# Patient Record
Sex: Male | Born: 1945 | Race: White | Hispanic: No | Marital: Married | Smoking: Former smoker
Health system: Southern US, Community
[De-identification: ages and names within clinical notes are randomized; demographics above are authoritative.]

## PROBLEM LIST (undated history)

## (undated) DIAGNOSIS — L309 Dermatitis, unspecified: Secondary | ICD-10-CM

## (undated) DIAGNOSIS — I2510 Atherosclerotic heart disease of native coronary artery without angina pectoris: Secondary | ICD-10-CM

---

## 2017-05-07 DIAGNOSIS — E784 Other hyperlipidemia: Secondary | ICD-10-CM | POA: Diagnosis not present

## 2017-05-07 DIAGNOSIS — Z23 Encounter for immunization: Secondary | ICD-10-CM | POA: Diagnosis not present

## 2017-05-07 DIAGNOSIS — R69 Illness, unspecified: Secondary | ICD-10-CM | POA: Diagnosis not present

## 2017-05-07 DIAGNOSIS — Z6828 Body mass index (BMI) 28.0-28.9, adult: Secondary | ICD-10-CM | POA: Diagnosis not present

## 2017-05-07 DIAGNOSIS — G4733 Obstructive sleep apnea (adult) (pediatric): Secondary | ICD-10-CM | POA: Diagnosis not present

## 2017-05-07 DIAGNOSIS — N183 Chronic kidney disease, stage 3 (moderate): Secondary | ICD-10-CM | POA: Diagnosis not present

## 2017-05-07 DIAGNOSIS — L409 Psoriasis, unspecified: Secondary | ICD-10-CM | POA: Diagnosis not present

## 2017-05-07 DIAGNOSIS — E038 Other specified hypothyroidism: Secondary | ICD-10-CM | POA: Diagnosis not present

## 2017-05-07 DIAGNOSIS — N401 Enlarged prostate with lower urinary tract symptoms: Secondary | ICD-10-CM | POA: Diagnosis not present

## 2017-05-07 DIAGNOSIS — Z1389 Encounter for screening for other disorder: Secondary | ICD-10-CM | POA: Diagnosis not present

## 2017-09-19 DIAGNOSIS — J40 Bronchitis, not specified as acute or chronic: Secondary | ICD-10-CM | POA: Diagnosis not present

## 2017-09-19 DIAGNOSIS — R05 Cough: Secondary | ICD-10-CM | POA: Diagnosis not present

## 2017-09-19 DIAGNOSIS — Z6827 Body mass index (BMI) 27.0-27.9, adult: Secondary | ICD-10-CM | POA: Diagnosis not present

## 2017-09-19 DIAGNOSIS — E038 Other specified hypothyroidism: Secondary | ICD-10-CM | POA: Diagnosis not present

## 2017-09-19 DIAGNOSIS — R0602 Shortness of breath: Secondary | ICD-10-CM | POA: Diagnosis not present

## 2017-09-19 DIAGNOSIS — E7849 Other hyperlipidemia: Secondary | ICD-10-CM | POA: Diagnosis not present

## 2017-09-19 DIAGNOSIS — R69 Illness, unspecified: Secondary | ICD-10-CM | POA: Diagnosis not present

## 2017-10-30 DIAGNOSIS — R82998 Other abnormal findings in urine: Secondary | ICD-10-CM | POA: Diagnosis not present

## 2017-10-30 DIAGNOSIS — E038 Other specified hypothyroidism: Secondary | ICD-10-CM | POA: Diagnosis not present

## 2017-10-30 DIAGNOSIS — Z0000 Encounter for general adult medical examination without abnormal findings: Secondary | ICD-10-CM | POA: Diagnosis not present

## 2017-10-30 DIAGNOSIS — Z125 Encounter for screening for malignant neoplasm of prostate: Secondary | ICD-10-CM | POA: Diagnosis not present

## 2017-11-11 DIAGNOSIS — J209 Acute bronchitis, unspecified: Secondary | ICD-10-CM | POA: Diagnosis not present

## 2017-11-11 DIAGNOSIS — R0602 Shortness of breath: Secondary | ICD-10-CM | POA: Diagnosis not present

## 2017-11-11 DIAGNOSIS — R69 Illness, unspecified: Secondary | ICD-10-CM | POA: Diagnosis not present

## 2017-11-11 DIAGNOSIS — Z6827 Body mass index (BMI) 27.0-27.9, adult: Secondary | ICD-10-CM | POA: Diagnosis not present

## 2017-11-11 DIAGNOSIS — R05 Cough: Secondary | ICD-10-CM | POA: Diagnosis not present

## 2017-11-14 ENCOUNTER — Other Ambulatory Visit (HOSPITAL_COMMUNITY): Payer: Self-pay | Admitting: Respiratory Therapy

## 2017-11-14 DIAGNOSIS — F17200 Nicotine dependence, unspecified, uncomplicated: Secondary | ICD-10-CM

## 2017-11-14 DIAGNOSIS — R0602 Shortness of breath: Secondary | ICD-10-CM

## 2017-11-19 ENCOUNTER — Ambulatory Visit (HOSPITAL_COMMUNITY)
Admission: RE | Admit: 2017-11-19 | Discharge: 2017-11-19 | Disposition: A | Discharge status: Home / Self Care | Payer: Medicare HMO | Source: Ambulatory Visit | Attending: Endocrinology | Admitting: Endocrinology

## 2017-11-19 DIAGNOSIS — E038 Other specified hypothyroidism: Secondary | ICD-10-CM | POA: Diagnosis not present

## 2017-11-19 DIAGNOSIS — R0602 Shortness of breath: Secondary | ICD-10-CM | POA: Diagnosis not present

## 2017-11-19 DIAGNOSIS — E7849 Other hyperlipidemia: Secondary | ICD-10-CM | POA: Diagnosis not present

## 2017-11-19 DIAGNOSIS — F17200 Nicotine dependence, unspecified, uncomplicated: Secondary | ICD-10-CM | POA: Diagnosis present

## 2017-11-19 DIAGNOSIS — R69 Illness, unspecified: Secondary | ICD-10-CM | POA: Diagnosis not present

## 2017-11-19 DIAGNOSIS — N183 Chronic kidney disease, stage 3 (moderate): Secondary | ICD-10-CM | POA: Diagnosis not present

## 2017-11-19 DIAGNOSIS — I739 Peripheral vascular disease, unspecified: Secondary | ICD-10-CM | POA: Diagnosis not present

## 2017-11-19 DIAGNOSIS — N401 Enlarged prostate with lower urinary tract symptoms: Secondary | ICD-10-CM | POA: Diagnosis not present

## 2017-11-19 DIAGNOSIS — J449 Chronic obstructive pulmonary disease, unspecified: Secondary | ICD-10-CM | POA: Insufficient documentation

## 2017-11-19 DIAGNOSIS — I714 Abdominal aortic aneurysm, without rupture: Secondary | ICD-10-CM | POA: Diagnosis not present

## 2017-11-19 DIAGNOSIS — Z0000 Encounter for general adult medical examination without abnormal findings: Secondary | ICD-10-CM | POA: Diagnosis not present

## 2017-11-19 DIAGNOSIS — G4733 Obstructive sleep apnea (adult) (pediatric): Secondary | ICD-10-CM | POA: Diagnosis not present

## 2017-11-19 LAB — PULMONARY FUNCTION TEST
DL/VA % PRED: 82 %
DL/VA: 3.63 ml/min/mmHg/L
DLCO unc % pred: 53 %
DLCO unc: 15.25 ml/min/mmHg
FEF 25-75 POST: 0.73 L/s
FEF 25-75 Pre: 0.28 L/sec
FEF2575-%Change-Post: 155 %
FEF2575-%PRED-POST: 36 %
FEF2575-%PRED-PRE: 14 %
FEV1-%Change-Post: 32 %
FEV1-%Pred-Post: 45 %
FEV1-%Pred-Pre: 34 %
FEV1-PRE: 0.93 L
FEV1-Post: 1.23 L
FEV1FVC-%Change-Post: -7 %
FEV1FVC-%Pred-Pre: 58 %
FEV6-%Change-Post: 38 %
FEV6-%PRED-PRE: 51 %
FEV6-%Pred-Post: 71 %
FEV6-Post: 2.51 L
FEV6-Pre: 1.82 L
FEV6FVC-%Change-Post: -1 %
FEV6FVC-%Pred-Post: 88 %
FEV6FVC-%Pred-Pre: 90 %
FVC-%CHANGE-POST: 42 %
FVC-%PRED-PRE: 57 %
FVC-%Pred-Post: 81 %
FVC-POST: 3.09 L
FVC-PRE: 2.16 L
PRE FEV1/FVC RATIO: 43 %
Post FEV1/FVC ratio: 40 %
Post FEV6/FVC ratio: 83 %
Pre FEV6/FVC Ratio: 84 %
RV % pred: 221 %
RV: 5.25 L
TLC % pred: 117 %
TLC: 7.55 L

## 2017-11-19 MED ORDER — ALBUTEROL SULFATE (2.5 MG/3ML) 0.083% IN NEBU
2.5000 mg | INHALATION_SOLUTION | Freq: Once | RESPIRATORY_TRACT | Status: AC
  Administered 2017-11-19: 2.5 mg via RESPIRATORY_TRACT

## 2018-04-10 DIAGNOSIS — N183 Chronic kidney disease, stage 3 (moderate): Secondary | ICD-10-CM | POA: Diagnosis not present

## 2018-04-10 DIAGNOSIS — L409 Psoriasis, unspecified: Secondary | ICD-10-CM | POA: Diagnosis not present

## 2018-04-10 DIAGNOSIS — Z6827 Body mass index (BMI) 27.0-27.9, adult: Secondary | ICD-10-CM | POA: Diagnosis not present

## 2018-04-10 DIAGNOSIS — I739 Peripheral vascular disease, unspecified: Secondary | ICD-10-CM | POA: Diagnosis not present

## 2018-04-10 DIAGNOSIS — J449 Chronic obstructive pulmonary disease, unspecified: Secondary | ICD-10-CM | POA: Diagnosis not present

## 2018-04-10 DIAGNOSIS — E7849 Other hyperlipidemia: Secondary | ICD-10-CM | POA: Diagnosis not present

## 2018-04-10 DIAGNOSIS — N401 Enlarged prostate with lower urinary tract symptoms: Secondary | ICD-10-CM | POA: Diagnosis not present

## 2018-04-10 DIAGNOSIS — R69 Illness, unspecified: Secondary | ICD-10-CM | POA: Diagnosis not present

## 2018-04-10 DIAGNOSIS — E038 Other specified hypothyroidism: Secondary | ICD-10-CM | POA: Diagnosis not present

## 2018-06-26 DIAGNOSIS — R69 Illness, unspecified: Secondary | ICD-10-CM | POA: Diagnosis not present

## 2018-07-16 DIAGNOSIS — R69 Illness, unspecified: Secondary | ICD-10-CM | POA: Diagnosis not present

## 2018-07-16 DIAGNOSIS — R0602 Shortness of breath: Secondary | ICD-10-CM | POA: Diagnosis not present

## 2018-07-16 DIAGNOSIS — R05 Cough: Secondary | ICD-10-CM | POA: Diagnosis not present

## 2018-07-16 DIAGNOSIS — Z6826 Body mass index (BMI) 26.0-26.9, adult: Secondary | ICD-10-CM | POA: Diagnosis not present

## 2018-07-16 DIAGNOSIS — J069 Acute upper respiratory infection, unspecified: Secondary | ICD-10-CM | POA: Diagnosis not present

## 2018-07-16 DIAGNOSIS — J019 Acute sinusitis, unspecified: Secondary | ICD-10-CM | POA: Diagnosis not present

## 2018-12-02 DIAGNOSIS — E038 Other specified hypothyroidism: Secondary | ICD-10-CM | POA: Diagnosis not present

## 2018-12-02 DIAGNOSIS — E7849 Other hyperlipidemia: Secondary | ICD-10-CM | POA: Diagnosis not present

## 2018-12-02 DIAGNOSIS — Z125 Encounter for screening for malignant neoplasm of prostate: Secondary | ICD-10-CM | POA: Diagnosis not present

## 2018-12-03 DIAGNOSIS — R69 Illness, unspecified: Secondary | ICD-10-CM | POA: Diagnosis not present

## 2018-12-04 DIAGNOSIS — R82998 Other abnormal findings in urine: Secondary | ICD-10-CM | POA: Diagnosis not present

## 2018-12-11 DIAGNOSIS — N183 Chronic kidney disease, stage 3 (moderate): Secondary | ICD-10-CM | POA: Diagnosis not present

## 2018-12-11 DIAGNOSIS — Z1331 Encounter for screening for depression: Secondary | ICD-10-CM | POA: Diagnosis not present

## 2018-12-11 DIAGNOSIS — L409 Psoriasis, unspecified: Secondary | ICD-10-CM | POA: Diagnosis not present

## 2018-12-11 DIAGNOSIS — N401 Enlarged prostate with lower urinary tract symptoms: Secondary | ICD-10-CM | POA: Diagnosis not present

## 2018-12-11 DIAGNOSIS — J449 Chronic obstructive pulmonary disease, unspecified: Secondary | ICD-10-CM | POA: Diagnosis not present

## 2018-12-11 DIAGNOSIS — R69 Illness, unspecified: Secondary | ICD-10-CM | POA: Diagnosis not present

## 2018-12-11 DIAGNOSIS — Z0000 Encounter for general adult medical examination without abnormal findings: Secondary | ICD-10-CM | POA: Diagnosis not present

## 2018-12-11 DIAGNOSIS — I739 Peripheral vascular disease, unspecified: Secondary | ICD-10-CM | POA: Diagnosis not present

## 2018-12-11 DIAGNOSIS — Z1339 Encounter for screening examination for other mental health and behavioral disorders: Secondary | ICD-10-CM | POA: Diagnosis not present

## 2018-12-11 DIAGNOSIS — E785 Hyperlipidemia, unspecified: Secondary | ICD-10-CM | POA: Diagnosis not present

## 2018-12-11 DIAGNOSIS — I714 Abdominal aortic aneurysm, without rupture: Secondary | ICD-10-CM | POA: Diagnosis not present

## 2018-12-11 DIAGNOSIS — E039 Hypothyroidism, unspecified: Secondary | ICD-10-CM | POA: Diagnosis not present

## 2019-05-19 DIAGNOSIS — N401 Enlarged prostate with lower urinary tract symptoms: Secondary | ICD-10-CM | POA: Diagnosis not present

## 2019-05-19 DIAGNOSIS — J449 Chronic obstructive pulmonary disease, unspecified: Secondary | ICD-10-CM | POA: Diagnosis not present

## 2019-05-19 DIAGNOSIS — N1831 Chronic kidney disease, stage 3a: Secondary | ICD-10-CM | POA: Diagnosis not present

## 2019-05-19 DIAGNOSIS — E039 Hypothyroidism, unspecified: Secondary | ICD-10-CM | POA: Diagnosis not present

## 2019-05-19 DIAGNOSIS — G4733 Obstructive sleep apnea (adult) (pediatric): Secondary | ICD-10-CM | POA: Diagnosis not present

## 2019-05-19 DIAGNOSIS — R69 Illness, unspecified: Secondary | ICD-10-CM | POA: Diagnosis not present

## 2019-05-19 DIAGNOSIS — E785 Hyperlipidemia, unspecified: Secondary | ICD-10-CM | POA: Diagnosis not present

## 2019-05-19 DIAGNOSIS — I739 Peripheral vascular disease, unspecified: Secondary | ICD-10-CM | POA: Diagnosis not present

## 2019-05-19 DIAGNOSIS — L409 Psoriasis, unspecified: Secondary | ICD-10-CM | POA: Diagnosis not present

## 2019-05-21 DIAGNOSIS — Z23 Encounter for immunization: Secondary | ICD-10-CM | POA: Diagnosis not present

## 2019-08-12 DIAGNOSIS — R634 Abnormal weight loss: Secondary | ICD-10-CM | POA: Diagnosis not present

## 2019-08-12 DIAGNOSIS — R69 Illness, unspecified: Secondary | ICD-10-CM | POA: Diagnosis not present

## 2019-08-12 DIAGNOSIS — J449 Chronic obstructive pulmonary disease, unspecified: Secondary | ICD-10-CM | POA: Diagnosis not present

## 2019-08-12 DIAGNOSIS — Z800 Family history of malignant neoplasm of digestive organs: Secondary | ICD-10-CM | POA: Diagnosis not present

## 2019-08-12 DIAGNOSIS — R5383 Other fatigue: Secondary | ICD-10-CM | POA: Diagnosis not present

## 2019-08-12 DIAGNOSIS — Z801 Family history of malignant neoplasm of trachea, bronchus and lung: Secondary | ICD-10-CM | POA: Diagnosis not present

## 2019-08-12 DIAGNOSIS — E039 Hypothyroidism, unspecified: Secondary | ICD-10-CM | POA: Diagnosis not present

## 2019-08-16 ENCOUNTER — Other Ambulatory Visit: Payer: Self-pay | Admitting: Endocrinology

## 2019-08-16 DIAGNOSIS — J410 Simple chronic bronchitis: Secondary | ICD-10-CM

## 2019-08-16 DIAGNOSIS — F17210 Nicotine dependence, cigarettes, uncomplicated: Secondary | ICD-10-CM

## 2019-08-24 ENCOUNTER — Ambulatory Visit
Admission: RE | Admit: 2019-08-24 | Discharge: 2019-08-24 | Disposition: A | Discharge status: Home / Self Care | Payer: Medicare HMO | Source: Ambulatory Visit | Attending: Endocrinology | Admitting: Endocrinology

## 2019-08-24 DIAGNOSIS — R69 Illness, unspecified: Secondary | ICD-10-CM | POA: Diagnosis not present

## 2019-08-24 DIAGNOSIS — F17210 Nicotine dependence, cigarettes, uncomplicated: Secondary | ICD-10-CM

## 2019-08-24 DIAGNOSIS — J410 Simple chronic bronchitis: Secondary | ICD-10-CM

## 2019-09-09 DIAGNOSIS — J449 Chronic obstructive pulmonary disease, unspecified: Secondary | ICD-10-CM | POA: Diagnosis not present

## 2019-09-09 DIAGNOSIS — Z800 Family history of malignant neoplasm of digestive organs: Secondary | ICD-10-CM | POA: Diagnosis not present

## 2019-09-09 DIAGNOSIS — R634 Abnormal weight loss: Secondary | ICD-10-CM | POA: Diagnosis not present

## 2019-09-09 DIAGNOSIS — R69 Illness, unspecified: Secondary | ICD-10-CM | POA: Diagnosis not present

## 2019-09-09 DIAGNOSIS — E875 Hyperkalemia: Secondary | ICD-10-CM | POA: Diagnosis not present

## 2019-09-09 DIAGNOSIS — E039 Hypothyroidism, unspecified: Secondary | ICD-10-CM | POA: Diagnosis not present

## 2019-10-08 ENCOUNTER — Ambulatory Visit: Payer: Medicare HMO | Attending: Internal Medicine

## 2019-10-08 DIAGNOSIS — Z23 Encounter for immunization: Secondary | ICD-10-CM | POA: Insufficient documentation

## 2019-10-08 NOTE — Progress Notes (Signed)
   Covid-19 Vaccination Clinic  Name:  Harry Lucas    MRN: [id number] DOB: 10-22-1945  10/08/2019  [name] was observed post Covid-19 immunization for 15 minutes without incidence. He was provided with Vaccine Information Sheet and instruction to access the V-Safe system.   [name] was instructed to call 911 with any severe reactions post vaccine: Marland Kitchen Difficulty breathing  . Swelling of your face and throat  . A fast heartbeat  . A bad rash all over your body  . Dizziness and weakness    Immunizations Administered    Name Date Dose VIS Date Route   Pfizer COVID-19 Vaccine 10/08/2019 11:11 AM 0.3 mL 07/23/2019 Intramuscular   Manufacturer: [employer], [employer]   Lot: [id number]   NDC: [address]-4

## 2019-11-02 ENCOUNTER — Ambulatory Visit: Payer: Medicare HMO | Attending: Internal Medicine

## 2019-11-02 DIAGNOSIS — Z23 Encounter for immunization: Secondary | ICD-10-CM

## 2019-11-02 NOTE — Progress Notes (Signed)
   Covid-19 Vaccination Clinic  Name:  Suresh Audi    MRN: [id number] DOB: [birth year]  11/02/2019  Mr. Dente was observed post Covid-19 immunization for 15 minutes without incident. He was provided with Vaccine Information Sheet and instruction to access the V-Safe system.   Mr. Baylock was instructed to call 911 with any severe reactions post vaccine: Marland Kitchen Difficulty breathing  . Swelling of face and throat  . A fast heartbeat  . A bad rash all over body  . Dizziness and weakness   Immunizations Administered    Name Date Dose VIS Date Route   Pfizer COVID-19 Vaccine 11/02/2019  1:27 PM 0.3 mL 07/23/2019 Intramuscular   Manufacturer: [employer], [employer]   Lot: [id number]   NDC: [address]-7

## 2019-12-07 DIAGNOSIS — E7849 Other hyperlipidemia: Secondary | ICD-10-CM | POA: Diagnosis not present

## 2019-12-07 DIAGNOSIS — Z0000 Encounter for general adult medical examination without abnormal findings: Secondary | ICD-10-CM | POA: Diagnosis not present

## 2019-12-07 DIAGNOSIS — Z125 Encounter for screening for malignant neoplasm of prostate: Secondary | ICD-10-CM | POA: Diagnosis not present

## 2019-12-07 DIAGNOSIS — E038 Other specified hypothyroidism: Secondary | ICD-10-CM | POA: Diagnosis not present

## 2019-12-13 DIAGNOSIS — I700 Atherosclerosis of aorta: Secondary | ICD-10-CM | POA: Diagnosis not present

## 2019-12-13 DIAGNOSIS — R82998 Other abnormal findings in urine: Secondary | ICD-10-CM | POA: Diagnosis not present

## 2019-12-13 DIAGNOSIS — D7589 Other specified diseases of blood and blood-forming organs: Secondary | ICD-10-CM | POA: Diagnosis not present

## 2019-12-13 DIAGNOSIS — J449 Chronic obstructive pulmonary disease, unspecified: Secondary | ICD-10-CM | POA: Diagnosis not present

## 2019-12-13 DIAGNOSIS — I2510 Atherosclerotic heart disease of native coronary artery without angina pectoris: Secondary | ICD-10-CM | POA: Diagnosis not present

## 2019-12-13 DIAGNOSIS — R634 Abnormal weight loss: Secondary | ICD-10-CM | POA: Diagnosis not present

## 2019-12-13 DIAGNOSIS — R69 Illness, unspecified: Secondary | ICD-10-CM | POA: Diagnosis not present

## 2019-12-13 DIAGNOSIS — L409 Psoriasis, unspecified: Secondary | ICD-10-CM | POA: Diagnosis not present

## 2019-12-13 DIAGNOSIS — Z0000 Encounter for general adult medical examination without abnormal findings: Secondary | ICD-10-CM | POA: Diagnosis not present

## 2019-12-13 DIAGNOSIS — N281 Cyst of kidney, acquired: Secondary | ICD-10-CM | POA: Diagnosis not present

## 2019-12-13 DIAGNOSIS — I739 Peripheral vascular disease, unspecified: Secondary | ICD-10-CM | POA: Diagnosis not present

## 2020-01-07 DIAGNOSIS — L821 Other seborrheic keratosis: Secondary | ICD-10-CM | POA: Diagnosis not present

## 2020-01-07 DIAGNOSIS — Z79899 Other long term (current) drug therapy: Secondary | ICD-10-CM | POA: Diagnosis not present

## 2020-01-07 DIAGNOSIS — L400 Psoriasis vulgaris: Secondary | ICD-10-CM | POA: Diagnosis not present

## 2020-01-17 DIAGNOSIS — I2510 Atherosclerotic heart disease of native coronary artery without angina pectoris: Secondary | ICD-10-CM | POA: Diagnosis present

## 2020-01-17 DIAGNOSIS — I700 Atherosclerosis of aorta: Secondary | ICD-10-CM | POA: Insufficient documentation

## 2020-01-18 DIAGNOSIS — I2510 Atherosclerotic heart disease of native coronary artery without angina pectoris: Secondary | ICD-10-CM | POA: Diagnosis not present

## 2020-01-18 DIAGNOSIS — E7800 Pure hypercholesterolemia, unspecified: Secondary | ICD-10-CM | POA: Diagnosis not present

## 2020-01-18 DIAGNOSIS — I700 Atherosclerosis of aorta: Secondary | ICD-10-CM | POA: Diagnosis not present

## 2020-01-18 DIAGNOSIS — R0600 Dyspnea, unspecified: Secondary | ICD-10-CM | POA: Diagnosis not present

## 2020-01-19 DIAGNOSIS — I498 Other specified cardiac arrhythmias: Secondary | ICD-10-CM | POA: Diagnosis not present

## 2020-02-22 DIAGNOSIS — H2512 Age-related nuclear cataract, left eye: Secondary | ICD-10-CM | POA: Diagnosis not present

## 2020-02-22 DIAGNOSIS — H04123 Dry eye syndrome of bilateral lacrimal glands: Secondary | ICD-10-CM | POA: Diagnosis not present

## 2020-02-22 DIAGNOSIS — H524 Presbyopia: Secondary | ICD-10-CM | POA: Diagnosis not present

## 2020-03-09 DIAGNOSIS — R0600 Dyspnea, unspecified: Secondary | ICD-10-CM | POA: Diagnosis not present

## 2020-03-09 DIAGNOSIS — I2510 Atherosclerotic heart disease of native coronary artery without angina pectoris: Secondary | ICD-10-CM | POA: Diagnosis not present

## 2020-06-13 DIAGNOSIS — I700 Atherosclerosis of aorta: Secondary | ICD-10-CM | POA: Diagnosis not present

## 2020-06-13 DIAGNOSIS — E785 Hyperlipidemia, unspecified: Secondary | ICD-10-CM | POA: Diagnosis not present

## 2020-06-13 DIAGNOSIS — R69 Illness, unspecified: Secondary | ICD-10-CM | POA: Diagnosis not present

## 2020-06-13 DIAGNOSIS — I2510 Atherosclerotic heart disease of native coronary artery without angina pectoris: Secondary | ICD-10-CM | POA: Diagnosis not present

## 2020-06-13 DIAGNOSIS — N401 Enlarged prostate with lower urinary tract symptoms: Secondary | ICD-10-CM | POA: Diagnosis not present

## 2020-06-13 DIAGNOSIS — I739 Peripheral vascular disease, unspecified: Secondary | ICD-10-CM | POA: Diagnosis not present

## 2020-06-13 DIAGNOSIS — J449 Chronic obstructive pulmonary disease, unspecified: Secondary | ICD-10-CM | POA: Diagnosis not present

## 2020-06-13 DIAGNOSIS — E039 Hypothyroidism, unspecified: Secondary | ICD-10-CM | POA: Diagnosis not present

## 2020-06-13 DIAGNOSIS — I714 Abdominal aortic aneurysm, without rupture: Secondary | ICD-10-CM | POA: Diagnosis not present

## 2021-01-25 DIAGNOSIS — G4733 Obstructive sleep apnea (adult) (pediatric): Secondary | ICD-10-CM | POA: Insufficient documentation

## 2021-01-25 DIAGNOSIS — L2084 Intrinsic (allergic) eczema: Secondary | ICD-10-CM

## 2021-05-22 IMAGING — CT CT CHEST LUNG CANCER SCREENING LOW DOSE W/O CM
2 of 5 series · 15 of 40 positions shown, 18 images · non-contrast
Comparison: None.

CLINICAL DATA: 73-year-old male current smoker, with 60 pack-year
history of smoking, for initial lung cancer screening

EXAM:
CT CHEST WITHOUT CONTRAST LOW-DOSE FOR LUNG CANCER SCREENING
TECHNIQUE: Multidetector CT imaging of the chest was performed following the
standard protocol without IV contrast.

[Series 4: lung 1.00 br44 cor · coronal · 0.68mm/px · 3 of 297 slices shown]
[im 60/297  lung]
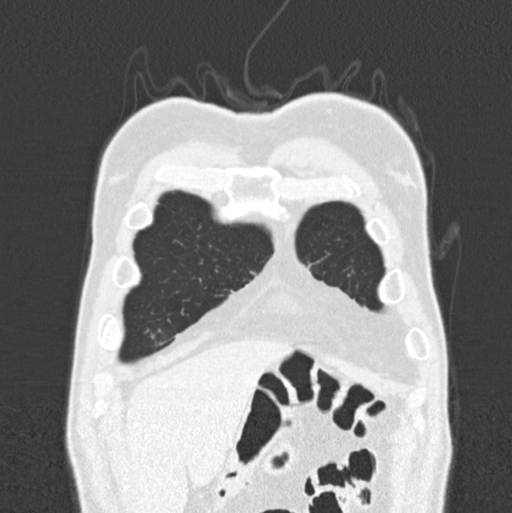
[im 119/297  lung]
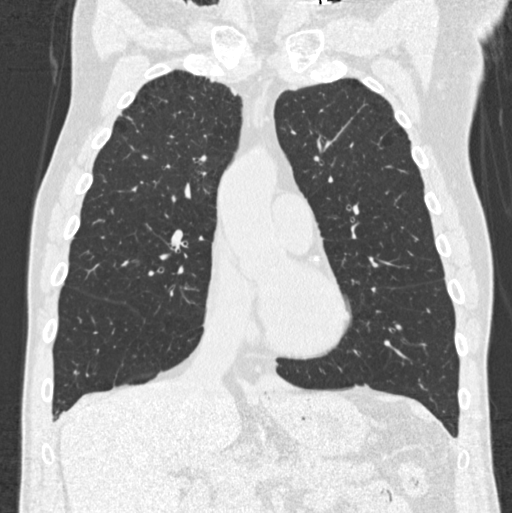
[im 178/297  lung]
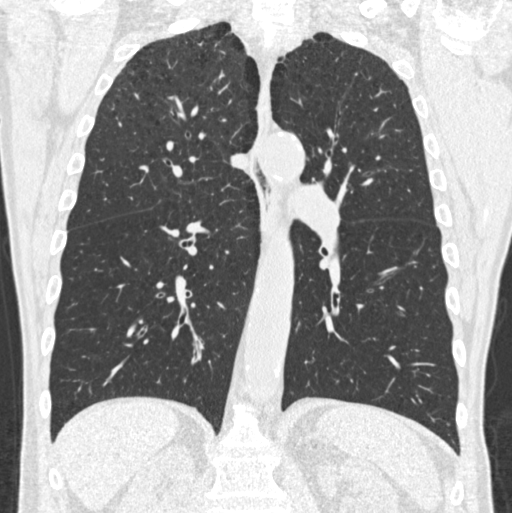

[Series 9: lung 1.00 br60 axial · axial · 0.68mm/px · z∈[-1210,-892]mm · 12 of 350 slices shown, 15 images]
[im 16/350  mediastinal]
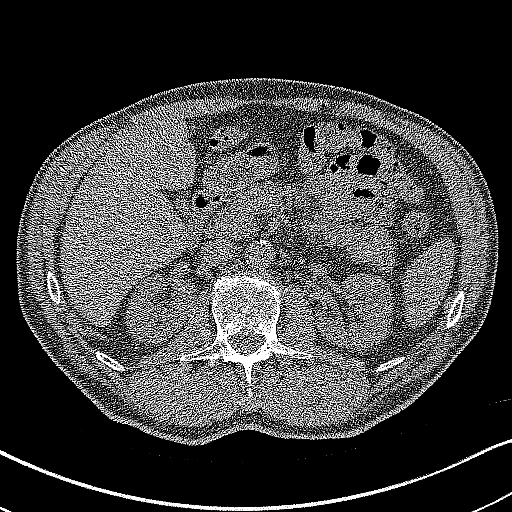
[im 16/350  lung]
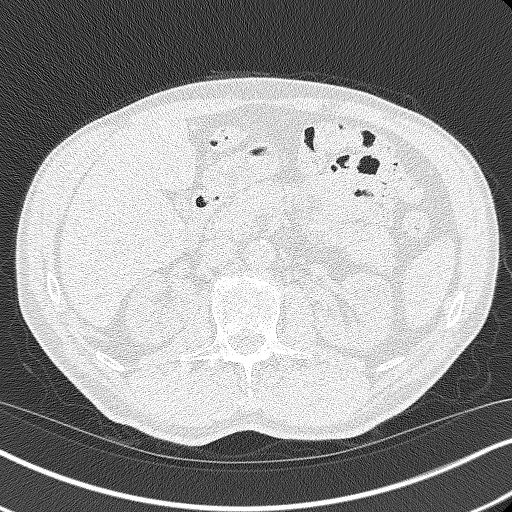
[im 48/350  lung]
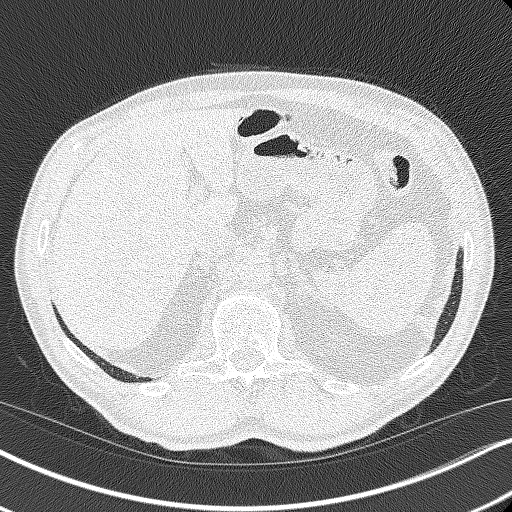
[im 80/350  lung]
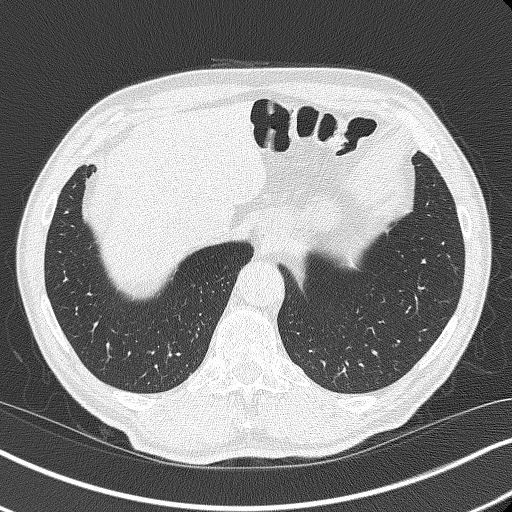
[im 112/350  lung]
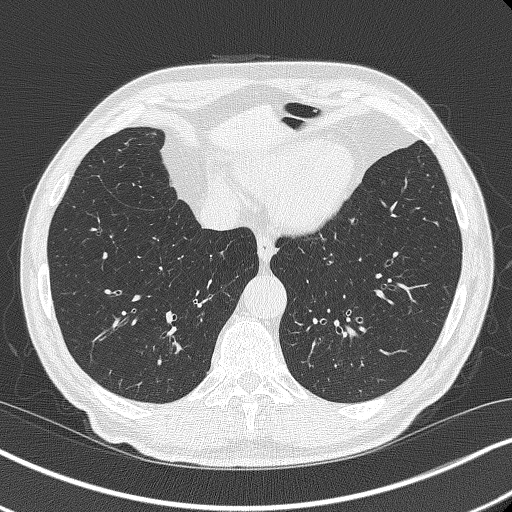
[im 127/350  mediastinal]
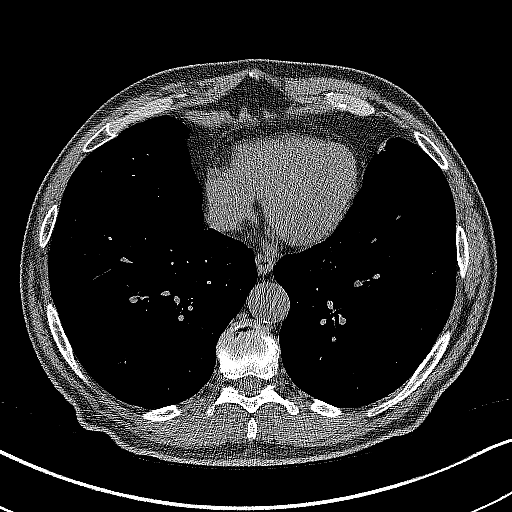
[im 127/350  lung]
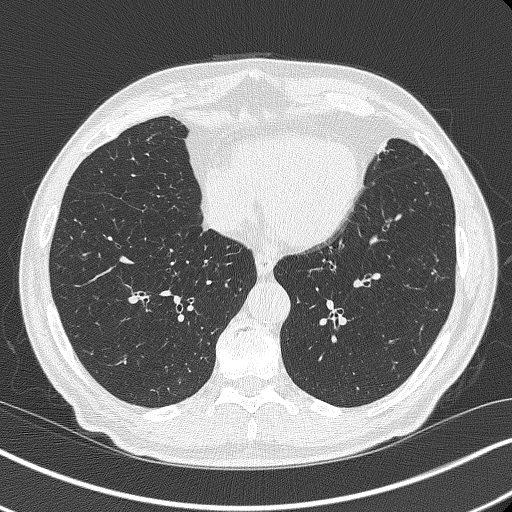
[im 159/350  lung]
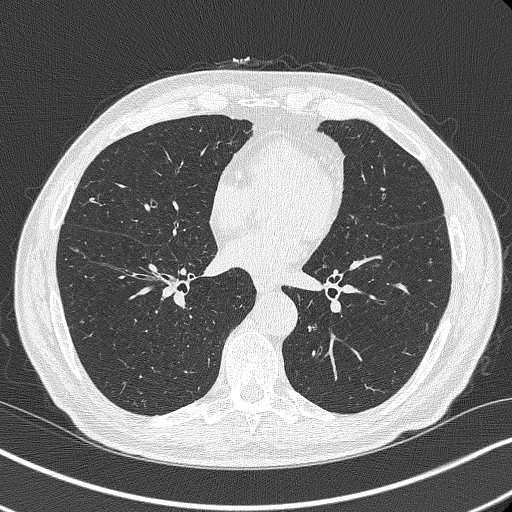
[im 191/350  lung]
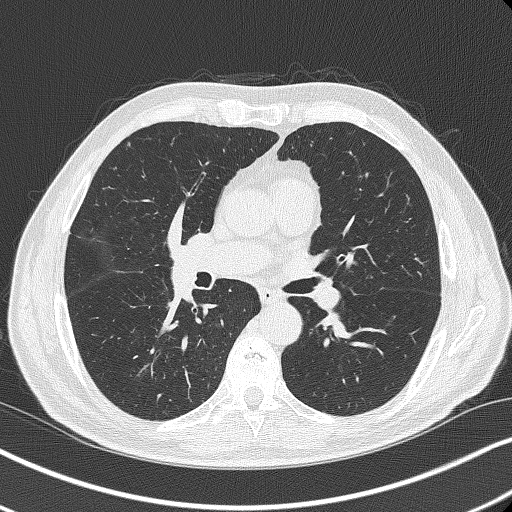
[im 223/350  lung]
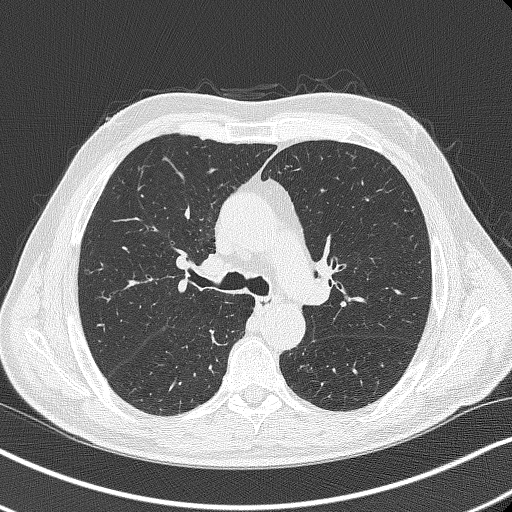
[im 238/350  mediastinal]
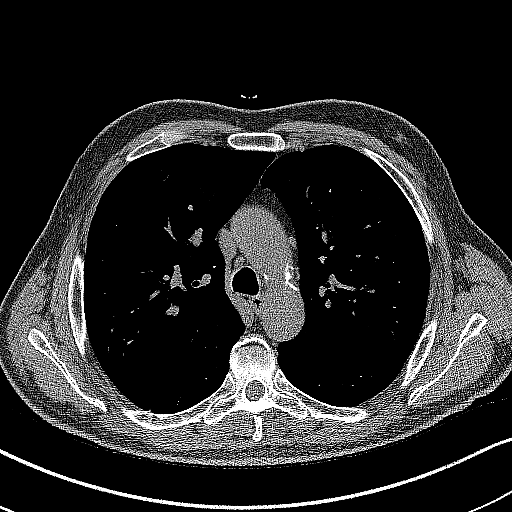
[im 238/350  lung]
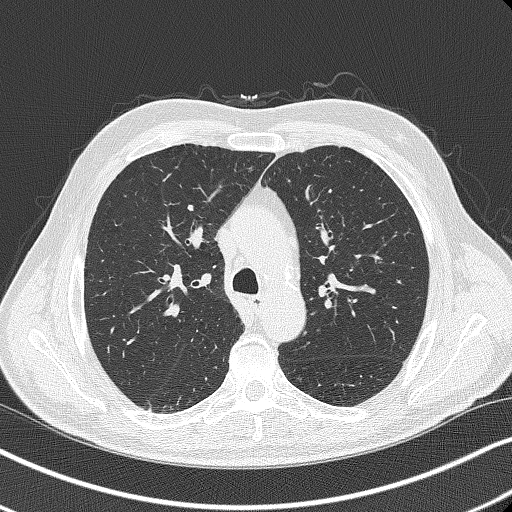
[im 270/350  lung]
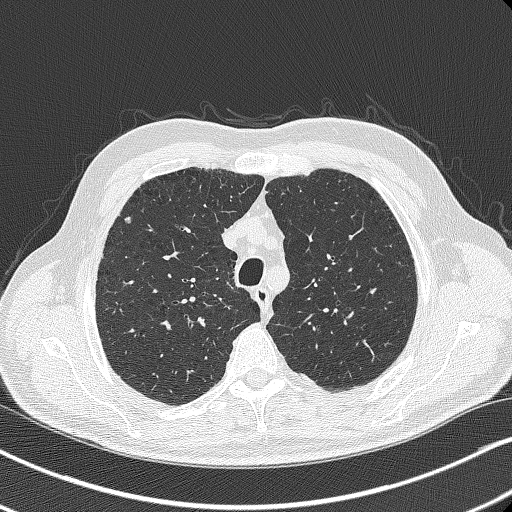
[im 302/350  lung]
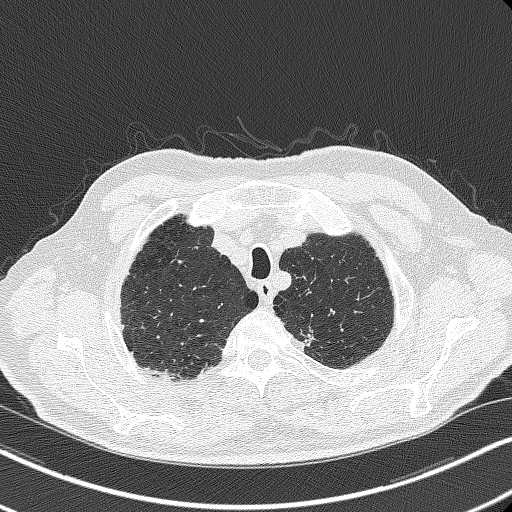
[im 334/350  lung]
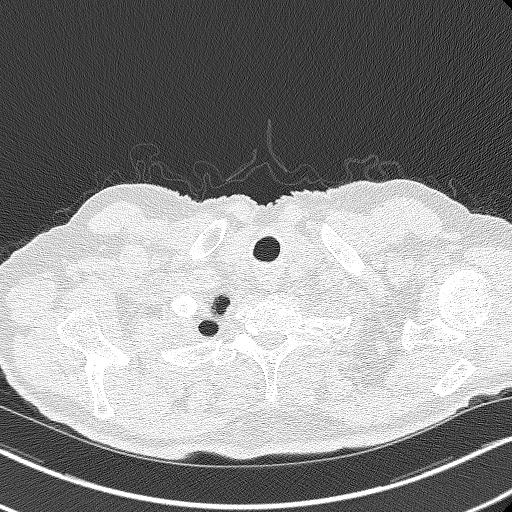

[15 of 40 positions shown; findings below may reference images not displayed]

FINDINGS: Cardiovascular: Heart is normal in size. No pericardial effusion.

No evidence of thoracic aortic aneurysm. Atherosclerotic
calcifications of the aortic arch.

Mild three-vessel coronary atherosclerosis.

Mediastinum/Nodes: No suspicious mediastinal lymphadenopathy.

Visualized thyroid is unremarkable.

Lungs/Pleura: Biapical pleural-parenchymal scarring.

Moderate centrilobular and paraseptal emphysematous changes, upper
lung predominant.

No focal consolidation.

Scattered small bilateral pulmonary nodules, including a 5.7 mm
subpleural nodule in the medial right upper lobe and a 5.7 mm
subpleural nodule in the posterior left upper lobe.

No pleural effusion or pneumothorax.

Upper Abdomen: Visualized upper abdomen is grossly unremarkable,
noting bilateral renal cysts measuring up to 2.5 cm on the left and
vascular calcifications.

Musculoskeletal: Visualized osseous structures are within normal
limits.
IMPRESSION: Lung-RADS 2, benign appearance or behavior. Continue annual
screening with low-dose chest CT without contrast in 12 months.

Aortic Atherosclerosis (IJNN4-2D5.5) and Emphysema (IJNN4-VPJ.W).

## 2021-08-11 ENCOUNTER — Emergency Department (HOSPITAL_COMMUNITY): Payer: Medicare Other

## 2021-08-11 ENCOUNTER — Encounter (HOSPITAL_COMMUNITY): Payer: Self-pay | Admitting: Emergency Medicine

## 2021-08-11 ENCOUNTER — Inpatient Hospital Stay (HOSPITAL_COMMUNITY): Payer: Medicare Other

## 2021-08-11 ENCOUNTER — Observation Stay (HOSPITAL_COMMUNITY)
Admission: EM | Admit: 2021-08-11 | Discharge: 2021-08-12 | Disposition: A | Discharge status: Home / Self Care | Payer: Medicare Other | Attending: Student | Admitting: Student

## 2021-08-11 DIAGNOSIS — N179 Acute kidney failure, unspecified: Secondary | ICD-10-CM | POA: Diagnosis not present

## 2021-08-11 DIAGNOSIS — I10 Essential (primary) hypertension: Secondary | ICD-10-CM | POA: Diagnosis not present

## 2021-08-11 DIAGNOSIS — E860 Dehydration: Secondary | ICD-10-CM

## 2021-08-11 DIAGNOSIS — J440 Chronic obstructive pulmonary disease with acute lower respiratory infection: Secondary | ICD-10-CM

## 2021-08-11 DIAGNOSIS — J189 Pneumonia, unspecified organism: Secondary | ICD-10-CM | POA: Insufficient documentation

## 2021-08-11 DIAGNOSIS — Z20822 Contact with and (suspected) exposure to covid-19: Secondary | ICD-10-CM | POA: Diagnosis not present

## 2021-08-11 DIAGNOSIS — A419 Sepsis, unspecified organism: Principal | ICD-10-CM | POA: Insufficient documentation

## 2021-08-11 DIAGNOSIS — J9601 Acute respiratory failure with hypoxia: Secondary | ICD-10-CM | POA: Diagnosis not present

## 2021-08-11 DIAGNOSIS — R6520 Severe sepsis without septic shock: Secondary | ICD-10-CM

## 2021-08-11 DIAGNOSIS — J111 Influenza due to unidentified influenza virus with other respiratory manifestations: Secondary | ICD-10-CM

## 2021-08-11 DIAGNOSIS — D72820 Lymphocytosis (symptomatic): Secondary | ICD-10-CM | POA: Diagnosis not present

## 2021-08-11 DIAGNOSIS — Z87891 Personal history of nicotine dependence: Secondary | ICD-10-CM | POA: Diagnosis not present

## 2021-08-11 DIAGNOSIS — Z79899 Other long term (current) drug therapy: Secondary | ICD-10-CM | POA: Insufficient documentation

## 2021-08-11 DIAGNOSIS — J09X1 Influenza due to identified novel influenza A virus with pneumonia: Secondary | ICD-10-CM | POA: Diagnosis not present

## 2021-08-11 DIAGNOSIS — R140 Abdominal distension (gaseous): Secondary | ICD-10-CM

## 2021-08-11 DIAGNOSIS — J441 Chronic obstructive pulmonary disease with (acute) exacerbation: Secondary | ICD-10-CM | POA: Diagnosis not present

## 2021-08-11 DIAGNOSIS — R6521 Severe sepsis with septic shock: Secondary | ICD-10-CM | POA: Insufficient documentation

## 2021-08-11 DIAGNOSIS — I2510 Atherosclerotic heart disease of native coronary artery without angina pectoris: Secondary | ICD-10-CM | POA: Diagnosis not present

## 2021-08-11 DIAGNOSIS — L309 Dermatitis, unspecified: Secondary | ICD-10-CM

## 2021-08-11 DIAGNOSIS — E039 Hypothyroidism, unspecified: Secondary | ICD-10-CM | POA: Diagnosis not present

## 2021-08-11 DIAGNOSIS — R0602 Shortness of breath: Secondary | ICD-10-CM | POA: Diagnosis present

## 2021-08-11 DIAGNOSIS — E871 Hypo-osmolality and hyponatremia: Secondary | ICD-10-CM

## 2021-08-11 DIAGNOSIS — L2084 Intrinsic (allergic) eczema: Secondary | ICD-10-CM

## 2021-08-11 DIAGNOSIS — J209 Acute bronchitis, unspecified: Secondary | ICD-10-CM

## 2021-08-11 DIAGNOSIS — J101 Influenza due to other identified influenza virus with other respiratory manifestations: Secondary | ICD-10-CM | POA: Diagnosis present

## 2021-08-11 DIAGNOSIS — R197 Diarrhea, unspecified: Secondary | ICD-10-CM

## 2021-08-11 HISTORY — DX: Dermatitis, unspecified: L30.9

## 2021-08-11 HISTORY — DX: Atherosclerotic heart disease of native coronary artery without angina pectoris: I25.10

## 2021-08-11 LAB — SODIUM, URINE, RANDOM: Sodium, Ur: 23 mmol/L

## 2021-08-11 LAB — CBC WITH DIFFERENTIAL/PLATELET
Abs Immature Granulocytes: 0.08 10*3/uL — ABNORMAL HIGH (ref 0.00–0.07)
Basophils Absolute: 0 10*3/uL (ref 0.0–0.1)
Basophils Relative: 0 %
Eosinophils Absolute: 0.1 10*3/uL (ref 0.0–0.5)
Eosinophils Relative: 0 %
HCT: 38.7 % — ABNORMAL LOW (ref 39.0–52.0)
Hemoglobin: 12.8 g/dL — ABNORMAL LOW (ref 13.0–17.0)
Immature Granulocytes: 1 %
Lymphocytes Relative: 5 %
Lymphs Abs: 0.8 10*3/uL (ref 0.7–4.0)
MCH: 33.8 pg (ref 26.0–34.0)
MCHC: 33.1 g/dL (ref 30.0–36.0)
MCV: 102.1 fL — ABNORMAL HIGH (ref 80.0–100.0)
Monocytes Absolute: 1.4 10*3/uL — ABNORMAL HIGH (ref 0.1–1.0)
Monocytes Relative: 9 %
Neutro Abs: 13.6 10*3/uL — ABNORMAL HIGH (ref 1.7–7.7)
Neutrophils Relative %: 85 %
Platelets: 275 10*3/uL (ref 150–400)
RBC: 3.79 MIL/uL — ABNORMAL LOW (ref 4.22–5.81)
RDW: 13.2 % (ref 11.5–15.5)
WBC: 15.9 10*3/uL — ABNORMAL HIGH (ref 4.0–10.5)
nRBC: 0 % (ref 0.0–0.2)

## 2021-08-11 LAB — URINALYSIS, COMPLETE (UACMP) WITH MICROSCOPIC
Bacteria, UA: NONE SEEN
Bilirubin Urine: NEGATIVE
Glucose, UA: NEGATIVE mg/dL
Ketones, ur: 5 mg/dL — AB
Leukocytes,Ua: NEGATIVE
Nitrite: NEGATIVE
Protein, ur: 30 mg/dL — AB
Specific Gravity, Urine: 1.014 (ref 1.005–1.030)
pH: 5 (ref 5.0–8.0)

## 2021-08-11 LAB — BASIC METABOLIC PANEL
Anion gap: 8 (ref 5–15)
BUN: 22 mg/dL (ref 8–23)
CO2: 24 mmol/L (ref 22–32)
Calcium: 8.8 mg/dL — ABNORMAL LOW (ref 8.9–10.3)
Chloride: 100 mmol/L (ref 98–111)
Creatinine, Ser: 1.83 mg/dL — ABNORMAL HIGH (ref 0.61–1.24)
GFR, Estimated: 38 mL/min — ABNORMAL LOW (ref >60)
Glucose, Bld: 113 mg/dL — ABNORMAL HIGH (ref 70–99)
Potassium: 3.9 mmol/L (ref 3.5–5.1)
Sodium: 132 mmol/L — ABNORMAL LOW (ref 135–145)

## 2021-08-11 LAB — BLOOD GAS, VENOUS
Acid-Base Excess: 0 mmol/L (ref 0.0–2.0)
Acid-base deficit: 1 mmol/L (ref 0.0–2.0)
Bicarbonate: 24.7 mmol/L (ref 20.0–28.0)
Bicarbonate: 25.2 mmol/L (ref 20.0–28.0)
O2 Saturation: 84.2 %
O2 Saturation: 24.8 %
Patient temperature: 98.6
Patient temperature: 98.6
pCO2, Ven: 42.9 mmHg — ABNORMAL LOW (ref 44.0–60.0)
pCO2, Ven: 51.1 mmHg (ref 44.0–60.0)
pH, Ven: 7.379 (ref 7.250–7.430)
pH, Ven: 7.313 (ref 7.250–7.430)
pO2, Ven: 52.4 mmHg — ABNORMAL HIGH (ref 32.0–45.0)
pO2, Ven: 20.9 mmHg — CL (ref 32.0–45.0)

## 2021-08-11 LAB — HEPATIC FUNCTION PANEL
ALT: 29 U/L (ref 0–44)
AST: 20 U/L (ref 15–41)
Albumin: 3.2 g/dL — ABNORMAL LOW (ref 3.5–5.0)
Alkaline Phosphatase: 51 U/L (ref 38–126)
Bilirubin, Direct: 0.2 mg/dL (ref 0.0–0.2)
Indirect Bilirubin: 0.5 mg/dL (ref 0.3–0.9)
Total Bilirubin: 0.7 mg/dL (ref 0.3–1.2)
Total Protein: 7.2 g/dL (ref 6.5–8.1)

## 2021-08-11 LAB — TSH: TSH: 0.987 u[IU]/mL (ref 0.350–4.500)

## 2021-08-11 LAB — RESP PANEL BY RT-PCR (FLU A&B, COVID) ARPGX2
Influenza A by PCR: NEGATIVE
Influenza B by PCR: NEGATIVE
SARS Coronavirus 2 by RT PCR: NEGATIVE

## 2021-08-11 LAB — CREATININE, URINE, RANDOM: Creatinine, Urine: 154.13 mg/dL

## 2021-08-11 LAB — D-DIMER, QUANTITATIVE: D-Dimer, Quant: 1.01 ug/mL-FEU — ABNORMAL HIGH (ref 0.00–0.50)

## 2021-08-11 LAB — BRAIN NATRIURETIC PEPTIDE: B Natriuretic Peptide: 54.3 pg/mL (ref 0.0–100.0)

## 2021-08-11 LAB — LACTIC ACID, PLASMA: Lactic Acid, Venous: 1.4 mmol/L (ref 0.5–1.9)

## 2021-08-11 LAB — CK: Total CK: 169 U/L (ref 49–397)

## 2021-08-11 LAB — PHOSPHORUS: Phosphorus: 3.7 mg/dL (ref 2.5–4.6)

## 2021-08-11 LAB — ETHANOL: Alcohol, Ethyl (B): <10 mg/dL (ref <10)

## 2021-08-11 LAB — MAGNESIUM: Magnesium: 2.3 mg/dL (ref 1.7–2.4)

## 2021-08-11 MED ORDER — SODIUM CHLORIDE 0.9 % IV SOLN
2.0000 g | INTRAVENOUS | Status: DC
  Filled 2021-08-11: qty 20

## 2021-08-11 MED ORDER — OSELTAMIVIR PHOSPHATE 75 MG PO CAPS: 75.0000 mg | ORAL_CAPSULE | Freq: Two times a day (BID) | ORAL | Status: DC

## 2021-08-11 MED ORDER — ACETAMINOPHEN 650 MG RE SUPP: 650.0000 mg | Freq: Four times a day (QID) | RECTAL | Status: DC | PRN

## 2021-08-11 MED ORDER — SODIUM CHLORIDE 0.9% FLUSH
3.0000 mL | Freq: Two times a day (BID) | INTRAVENOUS | Status: DC
  Administered 2021-08-11 – 2021-08-12 (×2): 3 mL via INTRAVENOUS

## 2021-08-11 MED ORDER — MONTELUKAST SODIUM 10 MG PO TABS
10.0000 mg | ORAL_TABLET | Freq: Every day | ORAL | Status: DC
  Administered 2021-08-12: 10 mg via ORAL
  Filled 2021-08-11: qty 1

## 2021-08-11 MED ORDER — SODIUM CHLORIDE 0.9 % IV SOLN
500.0000 mg | INTRAVENOUS | Status: DC
  Filled 2021-08-11: qty 5

## 2021-08-11 MED ORDER — PREDNISONE 20 MG PO TABS
40.0000 mg | ORAL_TABLET | Freq: Every day | ORAL | Status: DC
  Filled 2021-08-11: qty 2

## 2021-08-11 MED ORDER — SODIUM CHLORIDE 0.9 % IV SOLN
1.0000 g | Freq: Once | INTRAVENOUS | Status: AC
  Administered 2021-08-11: 1 g via INTRAVENOUS
  Filled 2021-08-11: qty 10

## 2021-08-11 MED ORDER — SODIUM CHLORIDE 0.9 % IV BOLUS
500.0000 mL | Freq: Once | INTRAVENOUS | Status: AC
  Administered 2021-08-11: 500 mL via INTRAVENOUS

## 2021-08-11 MED ORDER — MENTHOL 3 MG MT LOZG
1.0000 | LOZENGE | OROMUCOSAL | Status: DC | PRN
  Filled 2021-08-11: qty 9

## 2021-08-11 MED ORDER — OSELTAMIVIR PHOSPHATE 30 MG PO CAPS
30.0000 mg | ORAL_CAPSULE | Freq: Two times a day (BID) | ORAL | Status: DC
  Administered 2021-08-12: 30 mg via ORAL
  Filled 2021-08-11 (×2): qty 1

## 2021-08-11 MED ORDER — SIMVASTATIN 40 MG PO TABS: 40.0000 mg | ORAL_TABLET | Freq: Every day | ORAL | Status: DC

## 2021-08-11 MED ORDER — IPRATROPIUM BROMIDE 0.02 % IN SOLN
0.5000 mg | Freq: Four times a day (QID) | RESPIRATORY_TRACT | Status: DC
  Administered 2021-08-12 (×3): 0.5 mg via RESPIRATORY_TRACT
  Filled 2021-08-11 (×3): qty 2.5

## 2021-08-11 MED ORDER — ASPIRIN EC 81 MG PO TBEC
81.0000 mg | DELAYED_RELEASE_TABLET | Freq: Every day | ORAL | Status: DC
  Administered 2021-08-11 – 2021-08-12 (×2): 81 mg via ORAL
  Filled 2021-08-11 (×2): qty 1

## 2021-08-11 MED ORDER — SODIUM CHLORIDE 0.9 % IV SOLN
500.0000 mg | Freq: Once | INTRAVENOUS | Status: AC
  Administered 2021-08-11: 500 mg via INTRAVENOUS
  Filled 2021-08-11: qty 5

## 2021-08-11 MED ORDER — GUAIFENESIN ER 600 MG PO TB12
600.0000 mg | ORAL_TABLET | Freq: Two times a day (BID) | ORAL | Status: DC
  Administered 2021-08-12 (×2): 600 mg via ORAL
  Filled 2021-08-11 (×2): qty 1

## 2021-08-11 MED ORDER — HYDROCODONE-ACETAMINOPHEN 5-325 MG PO TABS: 1.0000 | ORAL_TABLET | ORAL | Status: DC | PRN

## 2021-08-11 MED ORDER — LEVALBUTEROL HCL 0.63 MG/3ML IN NEBU
0.6300 mg | INHALATION_SOLUTION | Freq: Four times a day (QID) | RESPIRATORY_TRACT | Status: DC
  Administered 2021-08-12 (×3): 0.63 mg via RESPIRATORY_TRACT
  Filled 2021-08-11 (×3): qty 3

## 2021-08-11 MED ORDER — ACETAMINOPHEN 325 MG PO TABS: 650.0000 mg | ORAL_TABLET | Freq: Four times a day (QID) | ORAL | Status: DC | PRN

## 2021-08-11 MED ORDER — OSELTAMIVIR PHOSPHATE 75 MG PO CAPS
75.0000 mg | ORAL_CAPSULE | Freq: Once | ORAL | Status: AC
  Administered 2021-08-12: 75 mg via ORAL
  Filled 2021-08-11: qty 1

## 2021-08-11 MED ORDER — IPRATROPIUM-ALBUTEROL 0.5-2.5 (3) MG/3ML IN SOLN
3.0000 mL | Freq: Four times a day (QID) | RESPIRATORY_TRACT | Status: DC
  Administered 2021-08-11: 3 mL via RESPIRATORY_TRACT
  Filled 2021-08-11: qty 3

## 2021-08-11 MED ORDER — MOMETASONE FURO-FORMOTEROL FUM 100-5 MCG/ACT IN AERO
2.0000 | INHALATION_SPRAY | Freq: Two times a day (BID) | RESPIRATORY_TRACT | Status: DC
  Administered 2021-08-11 – 2021-08-12 (×2): 2 via RESPIRATORY_TRACT
  Filled 2021-08-11: qty 8.8

## 2021-08-11 MED ORDER — ALBUTEROL SULFATE (2.5 MG/3ML) 0.083% IN NEBU: 2.5000 mg | INHALATION_SOLUTION | RESPIRATORY_TRACT | Status: DC | PRN

## 2021-08-11 MED ORDER — METHYLPREDNISOLONE SODIUM SUCC 40 MG IJ SOLR
40.0000 mg | Freq: Two times a day (BID) | INTRAMUSCULAR | Status: AC
  Administered 2021-08-12 (×2): 40 mg via INTRAVENOUS
  Filled 2021-08-11 (×2): qty 1

## 2021-08-11 MED ORDER — ALBUTEROL SULFATE HFA 108 (90 BASE) MCG/ACT IN AERS
2.0000 | INHALATION_SPRAY | Freq: Once | RESPIRATORY_TRACT | Status: AC
  Administered 2021-08-11: 2 via RESPIRATORY_TRACT
  Filled 2021-08-11: qty 6.7

## 2021-08-11 MED ORDER — SODIUM CHLORIDE 0.9 % IV SOLN
75.0000 mL/h | INTRAVENOUS | Status: AC
  Administered 2021-08-12: 75 mL/h via INTRAVENOUS

## 2021-08-11 NOTE — Assessment & Plan Note (Addendum)
-  -   Will initiate: Steroid taper  -  Antibiotics  -   XopenexPRN, - scheduled duoneb,  -  Breo or Dulera at discharge   -  Mucinex.  Titrate O2 to saturation >90%. Follow patients respiratory status.   influenza was positive at urgent care   VBG compensated    -  BiPAP ordered PRN for increased work of breathing.  Currently mentating well no evidence of symptomatic hypercarbia

## 2021-08-11 NOTE — Assessment & Plan Note (Signed)
- -  Patient presenting with productive cough, fever   Hypoxia  and infiltrate on chest x-ray    will admit for treatment of CAP will start on appropriate antibiotic coverage. - Rocephin/azithromycin Influenza A start on tamiflu   Obtain:  sputum cultures,               Order procalcitonin                  COVID PCR negative                   blood cultures and sputum cultures ordered                   strep pneumo UA antigen,                   check for Legionella antigen.                Provide oxygen as needed.

## 2021-08-11 NOTE — H&P (Signed)
Harry Lucas UPJ:[id number] DOB: 1945-12-03 DOA: 08/11/2021     PCP: Reynold Bowen, MD   Outpatient Specialists:   CARDS:  [facility]   Patient arrived to ER on 08/11/21 at 1638 Referred by Attending Valarie Merino, MD   Patient coming from: home Lives  With family    Chief Complaint:   Chief Complaint  Patient presents with   Shortness of Breath    HPI: Harry Lucas is a 75 y.o. male with medical history significant of COPD, CAD, hypothyroidism, HTN, HLD, depression    Presented with  3 days of Flu like symptoms  cough, congestion, rhinorrhea, scratchy throat, diarrhea, generalized weakness Shortness of breath associated diarrhea congestion and some fevers.  Has known history of COPD.  No chest pain associated that tachycardic up to 106 Was just diagnosed with influenza today and sent to emergency department from urgent care secondary to hypoxia down to mid 80s.  No sick contacts Bit of diarrhea. He quit smoking. He does drink on occasion. Reports recent travel drove to [place] SOB was a sudden change associated with wheezing Cough productive Pt on methotrexate for scalp exema Has   been vaccinated against COVID  and boosted had   flu shot   Initial COVID TEST  NEGATIVE  Influenza A positive Lab Results  Component Value Date   [place] NEGATIVE 08/11/2021     Regarding pertinent Chronic problems:     Hyperlipidemia - on statins Zocor Lipid Panel  No results found for: CHOL, TRIG, HDL, CHOLHDL, VLDL, LDLCALC, LDLDIRECT, LABVLDL   HTN on Cozaar      CAD  - On Aspirin, statin  CAD seen on CAT scan                -  followed by cardiology                 Hypothyroidism: No results found for: TSH on synthroid      COPD - not  followed by pulmonology   not  on baseline oxygen    Trilogy    OSA -on nocturnal CPAP     While in ER:    Chest x-ray showing evidence of community-acquired pneumonia started on Rocephin azithromycin  meeting SIRS criteria positive for influenza evidence of hypoxia requiring up to 4 use of 5 prior to     CXR - Bibasilar airspace opacities suggestive of infection/inflammation.    KUB -nonacute Following Medications were ordered in ER: Medications  cefTRIAXone (ROCEPHIN) 1 g in sodium chloride 0.9 % 100 mL IVPB (1 g Intravenous New Bag/Given 08/11/21 1852)  azithromycin (ZITHROMAX) 500 mg in sodium chloride 0.9 % 250 mL IVPB (has no administration in time range)  sodium chloride 0.9 % bolus 500 mL (has no administration in time range)  albuterol (VENTOLIN HFA) 108 (90 Base) MCG/ACT inhaler 2 puff (2 puffs Inhalation Given 08/11/21 1849)     ED Triage Vitals  Enc Vitals Group     BP 08/11/21 1647 107/84     Pulse Rate 08/11/21 1647 (!) 123     Resp 08/11/21 1655 (!) 22     Temp 08/11/21 1655 98.5 F (36.9 C)     Temp Source 08/11/21 1655 Oral     SpO2 08/11/21 1647 (!) 87 %     Weight --      Height --      Head Circumference --      Peak Flow --      Pain  Score --      Pain Loc --      Pain Edu? --      Excl. in [place]? --   TMAX(24)@     _________________________________________ Significant initial  Findings: Abnormal Labs Reviewed  BASIC METABOLIC PANEL - Abnormal; Notable for the following components:      Result Value   Sodium 132 (*)    Glucose, Bld 113 (*)    Creatinine, Ser 1.83 (*)    Calcium 8.8 (*)    GFR, Estimated 38 (*)    All other components within normal limits  CBC WITH DIFFERENTIAL/PLATELET - Abnormal; Notable for the following components:   WBC 15.9 (*)    RBC 3.79 (*)    Hemoglobin 12.8 (*)    HCT 38.7 (*)    MCV 102.1 (*)    Neutro Abs 13.6 (*)    Monocytes Absolute 1.4 (*)    Abs Immature Granulocytes 0.08 (*)    All other components within normal limits  BLOOD GAS, VENOUS - Abnormal; Notable for the following components:   pCO2, Ven 42.9 (*)    pO2, Ven 52.4 (*)    All other components within normal limits    ____  ECG:  Ordered Personally reviewed by me showing: HR  94 Rhythm:  NSR,    no evidence of ischemic changes QTC 422   ____________________ This patient meets SIRS Criteria and may be septic.     The recent clinical data is shown below. Vitals:   08/11/21 1655 08/11/21 1805 08/11/21 1830 08/11/21 1900  BP:  121/66 116/69 108/63  Pulse:  86 74 79  Resp: (!) 22 (!) 30 (!) 23 19  Temp: 98.5 F (36.9 C)     TempSrc: Oral     SpO2:  98% 99% 96%    WBC     Component Value Date/Time   WBC 15.9 (H) 08/11/2021 1745   LYMPHSABS 0.8 08/11/2021 1745   MONOABS 1.4 (H) 08/11/2021 1745   EOSABS 0.1 08/11/2021 1745   BASOSABS 0.0 08/11/2021 1745     Lactic Acid, Venous    Component Value Date/Time   LATICACIDVEN 1.4 08/11/2021 2032     Procalcitonin   Ordered     UA   no evidence of UTI    Urine analysis:    Component Value Date/Time   COLORURINE YELLOW 08/11/2021 1930   APPEARANCEUR HAZY (A) 08/11/2021 1930   LABSPEC 1.014 08/11/2021 1930   PHURINE 5.0 08/11/2021 1930   GLUCOSEU NEGATIVE 08/11/2021 1930   HGBUR MODERATE (A) 08/11/2021 [place] NEGATIVE 08/11/2021 1930   KETONESUR 5 (A) 08/11/2021 1930   PROTEINUR 30 (A) 08/11/2021 1930   NITRITE NEGATIVE 08/11/2021 1930   LEUKOCYTESUR NEGATIVE 08/11/2021 1930     Results for orders placed or performed during the hospital encounter of 08/11/21  Resp Panel by RT-PCR (Flu A&B, Covid) Nasopharyngeal Swab     Status: None   Collection Time: 08/11/21  5:23 PM   Specimen: Nasopharyngeal Swab; Nasopharyngeal(NP) swabs in vial transport medium  Result Value Ref Range Status   SARS Coronavirus 2 by RT PCR NEGATIVE NEGATIVE Final         Influenza A by PCR NEGATIVE NEGATIVE Final   Influenza B by PCR NEGATIVE NEGATIVE Final          ______________________________ Hospitalist was called for admission for CAP sepsis , influenza  The following Work up has been ordered so far:  Orders Placed This Encounter  Procedures    Resp Panel by RT-PCR (Flu A&B, Covid) Nasopharyngeal Swab   DG Chest 2 View   Basic metabolic panel   CBC with Differential/Platelet   Blood gas, venous   Brain natriuretic peptide   Cardiac monitoring   Check Peak Flow   Initiate Carrier Fluid Protocol   Consult to hospitalist   Pulse oximetry, continuous   ED EKG   Insert peripheral IV     OTHER Significant initial  Findings:  labs showing:    Recent Labs  Lab 08/11/21 1745 08/11/21 2034  NA 132*  --   K 3.9  --   CO2 24  --   GLUCOSE 113*  --   BUN 22  --   CREATININE 1.83*  --   CALCIUM 8.8*  --   MG  --  2.3  PHOS  --  3.7    Cr   Up from baseline see below Lab Results  Component Value Date   CREATININE 1.83 (H) 08/11/2021    No results for input(s): AST, ALT, ALKPHOS, BILITOT, PROT, ALBUMIN in the last 168 hours. Lab Results  Component Value Date   CALCIUM 8.8 (L) 08/11/2021          Plt: Lab Results  Component Value Date   PLT 275 08/11/2021       COVID-19 Labs  Recent Labs    08/11/21 2234  DDIMER 1.01*    Lab Results  Component Value Date   SARSCOV2NAA NEGATIVE 08/11/2021     Venous  Blood Gas result:  pH  7.313  pCO2  51.1  ABG    Component Value Date/Time   HCO3 24.7 08/11/2021 1745   O2SAT 84.2 08/11/2021 1745      Recent Labs  Lab 08/11/21 1745  WBC 15.9*  NEUTROABS 13.6*  HGB 12.8*  HCT 38.7*  MCV 102.1*  PLT 275    HG/HCT  stable,       Component Value Date/Time   HGB 12.8 (L) 08/11/2021 1745   HCT 38.7 (L) 08/11/2021 1745   MCV 102.1 (H) 08/11/2021 1745      Cardiac Panel (last 3 results) Recent Labs    08/11/21 2034  CKTOTAL 169    .car BNP (last 3 results) Recent Labs    08/11/21 1745  BNP 54.3         Cultures: No results found for: SDES, SPECREQUEST, CULT, REPTSTATUS   Radiological Exams on Admission: DG Chest 2 View  Result Date: 08/11/2021 CLINICAL DATA:  [place]. Associated narrowing of the right mainstem bronchus as well as  distal trachea due to external mass effect. EXAM: CHEST - 2 VIEW COMPARISON:  None. FINDINGS: The heart and mediastinal contours are within normal limits. Aortic calcification. Biapical pleural/pulmonary scarring. Emphysematous changes. Left base streaky airspace opacities. Right lower lobe interstitial and airspace opacities. No pulmonary edema. No pleural effusion. No pneumothorax. No acute osseous abnormality. IMPRESSION: 1. Bibasilar airspace opacities suggestive of infection/inflammation. 2. Aortic Atherosclerosis (ICD10-I70.0) and Emphysema (ICD10-J43.9). Electronically Signed   By: Iven Finn M.D.   On: 08/11/2021 17:24   DG Abd 1 View  Result Date: 08/11/2021 CLINICAL DATA:  Abdominal distension.  Diarrhea. EXAM: ABDOMEN - 1 VIEW COMPARISON:  None. FINDINGS: The bowel gas pattern is normal. No radio-opaque calculi or other significant radiographic abnormality are seen. IMPRESSION: Negative. Electronically Signed   By: Ronney Asters M.D.   On: 08/11/2021 22:11   _______________________________________________________________________________________________________ Latest   Blood pressure 108/63, pulse 79, temperature 98.5 F (  36.9 C), temperature source Oral, resp. rate 19, SpO2 96 %.   Vitals  labs and radiology finding personally reviewed  Review of Systems:    Pertinent positives include:  Fevers, chills, fatigue,  shortness of breath at rest.  dyspnea on exertion, Constitutional:  No weight loss, night sweats,  weight loss  HEENT:  No headaches, Difficulty swallowing,Tooth/dental problems,Sore throat,  No sneezing, itching, ear ache, nasal congestion, post nasal drip,  Cardio-vascular:  No chest pain, Orthopnea, PND, anasarca, dizziness, palpitations.no Bilateral lower extremity swelling  GI:  No heartburn, indigestion, abdominal pain, nausea, vomiting, diarrhea, change in bowel habits, loss of appetite, melena, blood in stool, hematemesis Resp:  no No excess mucus, no  productive cough, No non-productive cough, No coughing up of blood.No change in color of mucus.No wheezing. Skin:  no rash or lesions. No jaundice GU:  no dysuria, change in color of urine, no urgency or frequency. No straining to urinate.  No flank pain.  Musculoskeletal:  No joint pain or no joint swelling. No decreased range of motion. No back pain.  Psych:  No change in mood or affect. No depression or anxiety. No memory loss.  Neuro: no localizing neurological complaints, no tingling, no weakness, no double vision, no gait abnormality, no slurred speech, no confusion  All systems reviewed and apart from [place] all are negative _______________________________________________________________________________________________ Past Medical History:   Past Medical History:  Diagnosis Date   Coronary artery disease    Eczema       History reviewed. No pertinent surgical history.  Social History:  Ambulatory   independently      reports that he quit smoking about 2 years ago. His smoking use included cigarettes. He has been exposed to tobacco smoke. He has never used smokeless tobacco. He reports that he does not currently use alcohol. He reports that he does not use drugs.     Family History:   Family History  Problem Relation Age of Onset   Hypertension Other    ______________________________________________________________________________________________ Allergies: No Known Allergies   Prior to Admission medications   Not on File    ___________________________________________________________________________________________________ Physical Exam: Vitals with BMI 08/11/2021 08/11/2021 54/65/6812  Systolic 751 700 174  Diastolic 63 69 66  Pulse 79 74 86     1. General:  in  Acute distress increased work of breathing   -appearing 2. Psychological: Alert and   Oriented 3. Head/ENT:    Dry Mucous Membranes                          Head Non traumatic, neck supple                         Poor Dentition 4. SKIN:  decreased Skin turgor,  Skin clean Dry and intact no rash 5. Heart: Regular rate and rhythm no  Murmur, no Rub or gallop 6. Lungs:  some  wheezes diminished air movement   7. Abdomen: Soft,  non-tender,  distended   obese  bowel sounds present 8. Lower extremities: no clubbing, cyanosis, no  edema 9. Neurologically Grossly intact, moving all 4 extremities equally  10. MSK: Normal range of motion    Chart has been reviewed  ______________________________________________________________________________________________  Assessment/Plan  75 y.o. male with medical history significant of COPD, CAD, hypothyroidism, HTN, HLD, depression    Admitted for sepsis secondary to community-acquired pneumonia/influenza  Present on Admission:  CAP (community acquired pneumonia)  Acute respiratory failure with hypoxia (HCC)  CAD (coronary artery disease)  Influenza A  Hyponatremia  Dehydration  AKI (acute kidney injury) ([place])  Sepsis ([place])  Diarrhea     Acute respiratory failure with hypoxia (HCC)  this patient has acute respiratory failure with Hypoxia  as documented by the presence of following: O2 saturatio< 90% on RA  Likely due to  Pneumonia,  Provide O2 therapy and titrate as needed  Continuous pulse ox  check Pulse ox with ambulation prior to discharge  may need  TC consult for home O2 set up    flutter valve ordered D-dimer elevated hypoxia most likely secondary to combination of COPD exacerbation pneumonia and influenza. If high suspicion persists can obtain VQ scan given reduced renal function at this time  CAP (community acquired pneumonia)  - -Patient presenting with productive cough, fever   Hypoxia  and infiltrate on chest x-ray    will admit for treatment of CAP will start on appropriate antibiotic coverage. - Rocephin/azithromycin Influenza A start on tamiflu   Obtain:  sputum cultures,               Order procalcitonin                   COVID PCR negative                   blood cultures and sputum cultures ordered                   strep pneumo UA antigen,                   check for Legionella antigen.                Provide oxygen as needed.     CAD (coronary artery disease)  - chronic, continue aspirin  and statin    COPD with acute exacerbation ([place])  -  - Will initiate: Steroid taper  -  Antibiotics  -   XopenexPRN, - scheduled duoneb,  -  Breo or Dulera at discharge   -  Mucinex.  Titrate O2 to saturation >90%. Follow patients respiratory status.   influenza was positive at urgent care   [facility] compensated    -  BiPAP ordered PRN for increased work of breathing.  Currently mentating well no evidence of symptomatic hypercarbia   Influenza A Order Tamiflu, PCR neg in ER but tested positvite at urgent care earlies Supportive measures  Hyponatremia Likely secondary to dehydration will obtain urine electrolytes rehydrate and follow obtain TSH  Dehydration Will rehydrate and follow  AKI (acute kidney injury) ([place]) Likely secondary to dehydration obtain electrolytes follow renal status  Sepsis ([place])  -SIRS criteria met with  elevated white blood cell count,       Component Value Date/Time   WBC 15.9 (H) 08/11/2021 1745   LYMPHSABS 0.8 08/11/2021 1745     tachycardia   ,  fever  RR >20 Today's Vitals   08/11/21 1655 08/11/21 1805 08/11/21 1830 08/11/21 1900  BP:  121/66 116/69 108/63  Pulse:  86 74 79  Resp: (!) 22 (!) 30 (!) 23 19  Temp: 98.5 F (36.9 C)     TempSrc: Oral     SpO2:  98% 99% 96%    The recent clinical data is shown below. Vitals:   08/11/21 1655 08/11/21 1805 08/11/21 1830 08/11/21 1900  BP:  121/66 116/69 108/63  Pulse:  86 74 79  Resp: (!) 22 (!) 30 (!) 23 19  Temp: 98.5 F (36.9 C)     TempSrc: Oral     SpO2:  98% 99% 96%     -Most likely source being:  Pulmonary,   viral,     Patient meeting criteria for Severe sepsis with    evidence of end  organ damage/organ dysfunction such as    Acute hypoxia requiring new supplemental oxygen, SpO2: 96 % O2 Flow Rate (L/min): (S) 3 L/min    - Obtain serial lactic acid and procalcitonin level.  - Initiated IV antibiotics in ER: Antibiotics Given (last 72 hours)     Date/Time Action Medication Dose Rate   08/11/21 1852 New Bag/Given   cefTRIAXone (ROCEPHIN) 1 g in sodium chloride 0.9 % 100 mL IVPB 1 g 200 mL/hr       Will continue    - await results of blood and urine culture  - Rehydrate aggressively  Intravenous fluids were administered           7:45 PM   Eczema Hold methotrexate  Diarrhea Obtain gastric panel    Other plan as per orders.  DVT prophylaxis:  SCD       Code Status:    Code Status: Not on file FULL CODE   as per patient   I had personally discussed CODE STATUS with patient     Family Communication:   Family not at  Bedside    Disposition Plan:       To home once workup is complete and patient is stable   Following barriers for discharge:                            Electrolytes corrected                                                                                      Afebrile, white count improving able to transition to PO antibiotics                             Will need to be able to tolerate PO                            Will likely need home health, home O2, set up                             Consults called: none   Admission status:  ED Disposition     ED Disposition  Admit   Condition  --   [place]: [place] [[id number]]  Level of Care: Progressive [102]  Admit to Progressive based on following criteria: CARDIOVASCULAR & THORACIC of moderate stability with acute coronary syndrome symptoms/low risk myocardial infarction/hypertensive urgency/arrhythmias/heart failure potentially compromising stability and stable post cardiovascular intervention patients.  Admit to Progressive based on following  criteria: RESPIRATORY PROBLEMS hypoxemic/hypercapnic respiratory failure that is responsive to NIPPV (BiPAP) or High Flow Nasal Cannula (6-80 lpm).  Frequent assessment/intervention, no > Q2 hrs < Q4 hrs, to maintain oxygenation and pulmonary hygiene.  Admit to Progressive based on following criteria: MULTISYSTEM THREATS such as stable sepsis, metabolic/electrolyte imbalance with or without encephalopathy that is responding to early treatment.  May admit patient to [facility] or [facility] if equivalent level of care is available:: No  Covid Evaluation: Confirmed COVID Negative  Diagnosis: CAP (community acquired pneumonia) [[id number]]  Admitting Physician: Toy Baker [[id number]]  Attending Physician: Toy Baker [[id number]]  Estimated length of stay: past midnight tomorrow  Certification:: I certify this patient will need inpatient services for at least 2 midnights           inpatient     I Expect 2 midnight stay secondary to severity of patient's current illness need for inpatient interventions justified by the following:  hemodynamic instability despite optimal treatment (tachycardia  hypoxia,  ) * Severe lab/radiological/exam abnormalities including:     and extensive comorbidities including:    COPD/asthma   That are currently affecting medical management.   I expect  patient to be hospitalized for 2 midnights requiring inpatient medical care.  Patient is at high risk for adverse outcome (such as loss of life or disability) if not treated.  Indication for inpatient stay as follows:   Hemodynamic instability despite maximal medical therapy,    New or worsening hypoxia   Need for IV antibiotics, IV fluids,     Level of care    progressive tele indefinitely please discontinue once patient no longer qualifies COVID-19 Labs    Lab Results  Component Value Date   [place] 08/11/2021     Precautions: admitted as  Covid Negative       Chamya Hunton 08/12/2021, 12:04 AM    [facility]     after 2 AM please page floor coverage PA If 7AM-7PM, please contact the day team taking care of the patient using [email or web address]   Patient was evaluated in the context of the global COVID-19 pandemic, which necessitated consideration that the patient might be at risk for infection with the SARS-CoV-2 virus that causes COVID-19. Institutional protocols and algorithms that pertain to the evaluation of patients at risk for COVID-19 are in a state of rapid change based on information released by regulatory bodies including the CDC and federal and state organizations. These policies and algorithms were followed during the patient's care.

## 2021-08-11 NOTE — Assessment & Plan Note (Signed)
-   chronic, continue aspirin  and statin

## 2021-08-11 NOTE — ED Notes (Signed)
PO2 from [facility] - 20.9. Dr. Adela Glimpse informed via epic secure chat.

## 2021-08-11 NOTE — ED Provider Notes (Addendum)
[facility]-EMERGENCY DEPT Provider Note   CSN: [id number] Arrival date & time: 08/11/21  1638     History Chief Complaint  Patient presents with   Shortness of Breath    Harry Lucas is a 75 y.o. male with past medical history significant for CAD, eczema, prior tobacco use without any documented history of COPD who presents for evaluation of upper respiratory symptoms.  He has had 3 days of cough, congestion, rhinorrhea, scratchy throat, diarrhea, generalized weakness.  Was seen at urgent care earlier today.  Tested positive for influenza.  Was noted to be febrile, tachycardic, hypoxic to the high 80s.  Sent here for further evaluation.  He denies any headache, chest pain, shortness of breath abdominal pain, unilateral weakness or paresthesias.  Denies additional aggravating or alleviating factors.  Given Tylenol PTA at UC  History obtained from patient, wife in room and past medical records.  No interpreter is used.  HPI     Past Medical History:  Diagnosis Date   Coronary artery disease    Eczema     Patient Active Problem List   Diagnosis Date Noted   CAP (community acquired pneumonia) 08/11/2021   Acute respiratory failure with hypoxia (HCC) 08/11/2021   CAD (coronary artery disease) 08/11/2021   COPD (chronic obstructive pulmonary disease) (HCC) 08/11/2021   Influenza A 08/11/2021    History reviewed. No pertinent surgical history.     History reviewed. No pertinent family history.  Social History   Tobacco Use   Smoking status: Former    Types: Cigarettes    Quit date: 07/13/2019    Years since quitting: 2.0    Passive exposure: Past   Smokeless tobacco: Never  Vaping Use   Vaping Use: Never used  Substance Use Topics   Alcohol use: Not Currently   Drug use: Never    Home Medications Prior to Admission medications   Not on File    Allergies    Patient has no known allergies.  Review of Systems   Review of Systems   Constitutional:  Positive for activity change, appetite change, chills, fatigue and fever.  HENT:  Positive for congestion, postnasal drip, rhinorrhea and sore throat. Negative for sinus pressure, sinus pain, trouble swallowing and voice change.   Eyes: Negative.   Respiratory:  Positive for cough and shortness of breath. Negative for apnea, choking, chest tightness, wheezing and stridor.   Cardiovascular: Negative.   Gastrointestinal:  Positive for diarrhea. Negative for abdominal distention, abdominal pain, anal bleeding, blood in stool, constipation, nausea, rectal pain and vomiting.  Genitourinary: Negative.   Musculoskeletal:  Positive for myalgias.  Skin: Negative.   Neurological:  Positive for weakness (Generalized). Negative for dizziness, tremors, seizures, syncope, facial asymmetry, speech difficulty, light-headedness, numbness and headaches.  All other systems reviewed and are negative.  Physical Exam Updated Vital Signs BP 108/63    Pulse 79    Temp 98.5 F (36.9 C) (Oral)    Resp 19    SpO2 96%   Physical Exam Vitals and nursing note reviewed.  Constitutional:      General: He is not in acute distress.    Appearance: He is well-developed. He is ill-appearing. He is not toxic-appearing or diaphoretic.  HENT:     Head: Normocephalic and atraumatic.     Mouth/Throat:     Mouth: Mucous membranes are moist.  Eyes:     Pupils: Pupils are equal, round, and reactive to light.  Cardiovascular:     Rate  and Rhythm: Normal rate and regular rhythm.     Pulses: Normal pulses.          Radial pulses are 2+ on the right side and 2+ on the left side.       Dorsalis pedis pulses are 2+ on the right side and 2+ on the left side.     Heart sounds: Normal heart sounds.  Pulmonary:     Effort: Pulmonary effort is normal. No respiratory distress.     Comments: Coarse lung sounds throughout, mild expiratory wheeze.  Speaks in short sentences.  3 L via nasal cannula Chest:     Comments:  Nontender, no crepitus or step-off Abdominal:     General: Bowel sounds are normal. There is no distension.     Palpations: Abdomen is soft.     Comments: Soft, nontender without rebound or guarding  Musculoskeletal:        General: Normal range of motion.     Cervical back: Normal range of motion and neck supple.     Right lower leg: No tenderness. No edema.     Left lower leg: No tenderness. No edema.     Comments: Moves all 4 extremities without difficulty.  Compartments soft.  No Bony tenderness  Skin:    General: Skin is warm and dry.     Capillary Refill: Capillary refill takes less than 2 seconds.  Neurological:     General: No focal deficit present.     Mental Status: He is alert and oriented to person, place, and time.     Comments: CN 2-12 grossly intact Ambulatory Intact sensation Equal grip Bl    ED Results / Procedures / Treatments   Labs (all labs ordered are listed, but only abnormal results are displayed) Labs Reviewed  BASIC METABOLIC PANEL - Abnormal; Notable for the following components:      Result Value   Sodium 132 (*)    Glucose, Bld 113 (*)    Creatinine, Ser 1.83 (*)    Calcium 8.8 (*)    GFR, Estimated 38 (*)    All other components within normal limits  CBC WITH DIFFERENTIAL/PLATELET - Abnormal; Notable for the following components:   WBC 15.9 (*)    RBC 3.79 (*)    Hemoglobin 12.8 (*)    HCT 38.7 (*)    MCV 102.1 (*)    Neutro Abs 13.6 (*)    Monocytes Absolute 1.4 (*)    Abs Immature Granulocytes 0.08 (*)    All other components within normal limits  BLOOD GAS, VENOUS - Abnormal; Notable for the following components:   pCO2, Ven 42.9 (*)    pO2, Ven 52.4 (*)    All other components within normal limits  RESP PANEL BY RT-PCR (FLU A&B, COVID) ARPGX2  BRAIN NATRIURETIC PEPTIDE    EKG EKG Interpretation  Date/Time:  Saturday August 11 2021 17:01:18 EST Ventricular Rate:  94 PR Interval:  137 QRS Duration: 83 QT Interval:  337 QTC  Calculation: 422 R Axis:   65 Text Interpretation: Sinus rhythm Premature atrial complexes Low voltage, precordial leads Baseline wander in lead(s) V4 V5 No old tracing to compare Confirmed by Mancel Bale [phone]) on 08/11/2021 6:22:09 PM  Radiology DG Chest 2 View  Result Date: 08/11/2021 CLINICAL DATA:  Shob. Associated narrowing of the right mainstem bronchus as well as distal trachea due to external mass effect. EXAM: CHEST - 2 VIEW COMPARISON:  None. FINDINGS: The heart and mediastinal contours are within  normal limits. Aortic calcification. Biapical pleural/pulmonary scarring. Emphysematous changes. Left base streaky airspace opacities. Right lower lobe interstitial and airspace opacities. No pulmonary edema. No pleural effusion. No pneumothorax. No acute osseous abnormality. IMPRESSION: 1. Bibasilar airspace opacities suggestive of infection/inflammation. 2. Aortic Atherosclerosis (ICD10-I70.0) and Emphysema (ICD10-J43.9). Electronically Signed   By: Tish Frederickson M.D.   On: 08/11/2021 17:24     SEROLOGY RESULTS [facility] 08/11/2021 Component    Rapid Influenza A Ag   Component 08/11/2021     Rapid Influenza A Ag Positive Abnormal       Procedures .Critical Care Performed by: Linwood Dibbles, PA-C Authorized by: Linwood Dibbles, PA-C   Critical care provider statement:    Critical care time (minutes):  31   Critical care was necessary to treat or prevent imminent or life-threatening deterioration of the following conditions:  Respiratory failure   Critical care was time spent personally by me on the following activities:  Development of treatment plan with patient or surrogate, discussions with consultants, evaluation of patient's response to treatment, examination of patient, ordering and review of laboratory studies, ordering and review of radiographic studies, ordering and performing treatments and interventions, pulse oximetry,  re-evaluation of patient's condition, review of old charts and obtaining history from patient or surrogate   Care discussed with: admitting provider     Medications Ordered in ED Medications  cefTRIAXone (ROCEPHIN) 1 g in sodium chloride 0.9 % 100 mL IVPB (1 g Intravenous New Bag/Given 08/11/21 1852)  azithromycin (ZITHROMAX) 500 mg in sodium chloride 0.9 % 250 mL IVPB (has no administration in time range)  sodium chloride 0.9 % bolus 500 mL (has no administration in time range)  albuterol (VENTOLIN HFA) 108 (90 Base) MCG/ACT inhaler 2 puff (2 puffs Inhalation Given 08/11/21 1849)    ED Course  I have reviewed the triage vital signs and the nursing notes.  Pertinent labs & imaging results that were available during my care of the patient were reviewed by me and considered in my medical decision making (see chart for details).  Pleasant 75 year old here for evaluation of upper respiratory symptoms, currently on day #3.  Seen at urgent care earlier today noted to be tachycardic, tachypneic, hypoxic.  Diagnosed with influenza A at urgent care.  On arrival patient hypoxic into the mid 80s, subsequently requiring 3 L via nasal cannula.  Does not currently use oxygen at home.  He does not appear grossly fluid overloaded.  No history of PE or DVT.  He denies any chest pain.  Does admit to some diarrhea however no melena, bright red per rectum, no recent antibiotics or travel.  No associated abdominal pain.  Admit to some generalized weakness however no focal weakness on exam.  Labs and imaging personally reviewed and interpreted: CBC WBC at 15.9, hemoglobin 12.8, no prior to compare Metabolic panel mild hyponatremia 132, creatinine 1.83, no prior to compare VBG without acidosis, normal bicarb BNP 54.3 Chest x-ray with pneumonia EKG without ischemic changes  Given elevated leukocytosis will give dose of antibiotics for CAP.  Patient critically ill with acute hypoxic respiratory failure suspect due  to influenza, pneumonia.  Will be admitted for further management  CONSULT with Dr. Adela Glimpse with [facility] who agrees to evaluate patient for admission.  The patient appears reasonably stabilized for admission considering the current resources, flow, and capabilities available in the ED at this time, and I doubt any other [facility] requiring further screening and/or treatment in the ED prior  to admission.     MDM Rules/Calculators/A&P                             Final Clinical Impression(s) / ED Diagnoses Final diagnoses:  Influenza  Community acquired pneumonia, unspecified laterality  Acute respiratory failure with hypoxia (HCC)  AKI (acute kidney injury) [facility])    Rx / DC Orders ED Discharge Orders     None          Blakelyn Dinges A, PA-C 08/11/21 2023    Wynetta Fines, MD 08/14/21 1037

## 2021-08-11 NOTE — Subjective & Objective (Signed)
Shortness of breath associated diarrhea congestion and some fevers.  Has known history of COPD.  No chest pain associated that tachycardic up to 106 Was just diagnosed with influenza today and sent to emergency department from urgent care secondary to hypoxia down to mid 80s.

## 2021-08-11 NOTE — Assessment & Plan Note (Signed)
Will rehydrate and follow

## 2021-08-11 NOTE — Assessment & Plan Note (Addendum)
this patient has acute respiratory failure with Hypoxia  as documented by the presence of following: O2 saturatio< 90% on RA  Likely due to  Pneumonia,  Provide O2 therapy and titrate as needed  Continuous pulse ox  check Pulse ox with ambulation prior to discharge  may need  TC consult for home O2 set up    flutter valve ordered D-dimer elevated hypoxia most likely secondary to combination of COPD exacerbation pneumonia and influenza. If high suspicion persists can obtain VQ scan given reduced renal function at this time

## 2021-08-11 NOTE — ED Provider Notes (Signed)
Emergency Medicine Provider Triage Evaluation Note  Harry Lucas , a 75 y.o. male  was evaluated in triage.  Pt complains of   Progressively worsening shortness of breath for the last week with associated diarrhea, congestion, fevers at home.  Tested positive for influenza today urgent care where he was also found to be hypoxic.  Directed to the ED. Tylenol at 3:30 PM  History of COPD on Trelegy with rescue albuterol.  Review of Systems  Positive: Shortness of breath, diaphoresis, fever, congestion, diarrhea Negative: Chest pain, palpitations  Physical Exam  BP 107/84    Pulse (!) 106    Temp 98.5 F (36.9 C) (Oral)    Resp (!) 22    SpO2 94%  Gen:   Awake, diaphoretic Resp:  Normal effort on 3 L supplemental oxygen by nasal cannula to improve sats to the 90s from the 80s. MSK:   Moves extremities without difficulty  Other:  Tachycardic with regular rhythm.  Coarse breath sounds in the bases bilaterally with rales on the right lung base.  Medical Decision Making  Medically screening exam initiated at 5:23 PM.  Appropriate orders placed.  Reyaan Thoma was informed that the remainder of the evaluation will be completed by another provider, this initial triage assessment does not replace that evaluation, and the importance of remaining in the ED until their evaluation is complete.  This chart was dictated using voice recognition software, Dragon. Despite the best efforts of this provider to proofread and correct errors, errors may still occur which can change documentation meaning.    Sherrilee Gilles 08/11/21 1730    Mancel Bale, MD 08/11/21 2233

## 2021-08-11 NOTE — Assessment & Plan Note (Signed)
Likely secondary to dehydration obtain electrolytes follow renal status

## 2021-08-11 NOTE — Assessment & Plan Note (Signed)
Hold methotrexate

## 2021-08-11 NOTE — Assessment & Plan Note (Signed)
Likely secondary to dehydration will obtain urine electrolytes rehydrate and follow obtain TSH

## 2021-08-11 NOTE — ED Triage Notes (Addendum)
Flu like sx since Thursday. Dx with flu today. Sent here from UC due to low o2, diaphoresis, and tachycardia. 1G tylenol @330pm .

## 2021-08-11 NOTE — Assessment & Plan Note (Addendum)
Order Tamiflu, PCR neg in ER but tested positvite at urgent care earlies Supportive measures

## 2021-08-11 NOTE — Assessment & Plan Note (Signed)
-  SIRS criteria met with  elevated white blood cell count,       Component Value Date/Time   WBC 15.9 (H) 08/11/2021 1745   LYMPHSABS 0.8 08/11/2021 1745     tachycardia   ,  fever  RR >20 Today's Vitals   08/11/21 1655 08/11/21 1805 08/11/21 1830 08/11/21 1900  BP:  121/66 116/69 108/63  Pulse:  86 74 79  Resp: (!) 22 (!) 30 (!) 23 19  Temp: 98.5 F (36.9 C)     TempSrc: Oral     SpO2:  98% 99% 96%    The recent clinical data is shown below. Vitals:   08/11/21 1655 08/11/21 1805 08/11/21 1830 08/11/21 1900  BP:  121/66 116/69 108/63  Pulse:  86 74 79  Resp: (!) 22 (!) 30 (!) 23 19  Temp: 98.5 F (36.9 C)     TempSrc: Oral     SpO2:  98% 99% 96%     -Most likely source being:  Pulmonary,   viral,     Patient meeting criteria for Severe sepsis with    evidence of end organ damage/organ dysfunction such as    Acute hypoxia requiring new supplemental oxygen, SpO2: 96 % O2 Flow Rate (L/min): (S) 3 L/min    - Obtain serial lactic acid and procalcitonin level.  - Initiated IV antibiotics in ER: Antibiotics Given (last 72 hours)    Date/Time Action Medication Dose Rate   08/11/21 1852 New Bag/Given   cefTRIAXone (ROCEPHIN) 1 g in sodium chloride 0.9 % 100 mL IVPB 1 g 200 mL/hr      Will continue    - await results of blood and urine culture  - Rehydrate aggressively  Intravenous fluids were administered           7:45 PM

## 2021-08-12 ENCOUNTER — Inpatient Hospital Stay (HOSPITAL_COMMUNITY): Payer: Medicare Other

## 2021-08-12 ENCOUNTER — Other Ambulatory Visit: Payer: Self-pay

## 2021-08-12 ENCOUNTER — Encounter (HOSPITAL_COMMUNITY): Payer: Self-pay | Admitting: Internal Medicine

## 2021-08-12 DIAGNOSIS — A419 Sepsis, unspecified organism: Secondary | ICD-10-CM | POA: Diagnosis not present

## 2021-08-12 DIAGNOSIS — R0602 Shortness of breath: Secondary | ICD-10-CM | POA: Diagnosis not present

## 2021-08-12 DIAGNOSIS — L2084 Intrinsic (allergic) eczema: Secondary | ICD-10-CM

## 2021-08-12 DIAGNOSIS — N179 Acute kidney failure, unspecified: Secondary | ICD-10-CM | POA: Diagnosis not present

## 2021-08-12 DIAGNOSIS — G4733 Obstructive sleep apnea (adult) (pediatric): Secondary | ICD-10-CM

## 2021-08-12 DIAGNOSIS — R7989 Other specified abnormal findings of blood chemistry: Secondary | ICD-10-CM

## 2021-08-12 DIAGNOSIS — J9601 Acute respiratory failure with hypoxia: Secondary | ICD-10-CM | POA: Diagnosis not present

## 2021-08-12 DIAGNOSIS — R197 Diarrhea, unspecified: Secondary | ICD-10-CM | POA: Diagnosis present

## 2021-08-12 DIAGNOSIS — Z9989 Dependence on other enabling machines and devices: Secondary | ICD-10-CM

## 2021-08-12 DIAGNOSIS — J189 Pneumonia, unspecified organism: Secondary | ICD-10-CM | POA: Diagnosis not present

## 2021-08-12 LAB — COMPREHENSIVE METABOLIC PANEL
ALT: 31 U/L (ref 0–44)
AST: 22 U/L (ref 15–41)
Albumin: 3.1 g/dL — ABNORMAL LOW (ref 3.5–5.0)
Alkaline Phosphatase: 55 U/L (ref 38–126)
Anion gap: 10 (ref 5–15)
BUN: 20 mg/dL (ref 8–23)
CO2: 22 mmol/L (ref 22–32)
Calcium: 8.9 mg/dL (ref 8.9–10.3)
Chloride: 104 mmol/L (ref 98–111)
Creatinine, Ser: 1.57 mg/dL — ABNORMAL HIGH (ref 0.61–1.24)
GFR, Estimated: 45 mL/min — ABNORMAL LOW (ref >60)
Glucose, Bld: 155 mg/dL — ABNORMAL HIGH (ref 70–99)
Potassium: 4.3 mmol/L (ref 3.5–5.1)
Sodium: 136 mmol/L (ref 135–145)
Total Bilirubin: 0.7 mg/dL (ref 0.3–1.2)
Total Protein: 7.3 g/dL (ref 6.5–8.1)

## 2021-08-12 LAB — RESPIRATORY PANEL BY PCR

## 2021-08-12 LAB — TSH: TSH: 0.62 u[IU]/mL (ref 0.350–4.500)

## 2021-08-12 LAB — CBC WITH DIFFERENTIAL/PLATELET
Abs Immature Granulocytes: 0.1 10*3/uL — ABNORMAL HIGH (ref 0.00–0.07)
Basophils Absolute: 0 10*3/uL (ref 0.0–0.1)
Basophils Relative: 0 %
Eosinophils Absolute: 0 10*3/uL (ref 0.0–0.5)
Eosinophils Relative: 0 %
HCT: 34.9 % — ABNORMAL LOW (ref 39.0–52.0)
Hemoglobin: 11.5 g/dL — ABNORMAL LOW (ref 13.0–17.0)
Immature Granulocytes: 1 %
Lymphocytes Relative: 1 %
Lymphs Abs: 0.2 10*3/uL — ABNORMAL LOW (ref 0.7–4.0)
MCH: 33.4 pg (ref 26.0–34.0)
MCHC: 33 g/dL (ref 30.0–36.0)
MCV: 101.5 fL — ABNORMAL HIGH (ref 80.0–100.0)
Monocytes Absolute: 0.4 10*3/uL (ref 0.1–1.0)
Monocytes Relative: 3 %
Neutro Abs: 14.5 10*3/uL — ABNORMAL HIGH (ref 1.7–7.7)
Neutrophils Relative %: 95 %
Platelets: 246 10*3/uL (ref 150–400)
RBC: 3.44 MIL/uL — ABNORMAL LOW (ref 4.22–5.81)
RDW: 13.3 % (ref 11.5–15.5)
WBC: 15.3 10*3/uL — ABNORMAL HIGH (ref 4.0–10.5)
nRBC: 0 % (ref 0.0–0.2)

## 2021-08-12 LAB — OSMOLALITY, URINE: Osmolality, Ur: 379 mOsm/kg (ref 300–900)

## 2021-08-12 LAB — PHOSPHORUS: Phosphorus: 2.8 mg/dL (ref 2.5–4.6)

## 2021-08-12 LAB — OSMOLALITY: Osmolality: 290 mOsm/kg (ref 275–295)

## 2021-08-12 LAB — CORTISOL-AM, BLOOD: Cortisol - AM: 15.7 ug/dL (ref 6.7–22.6)

## 2021-08-12 LAB — MAGNESIUM: Magnesium: 2.3 mg/dL (ref 1.7–2.4)

## 2021-08-12 LAB — PROCALCITONIN: Procalcitonin: 0.25 ng/mL

## 2021-08-12 LAB — STREP PNEUMONIAE URINARY ANTIGEN: Strep Pneumo Urinary Antigen: NEGATIVE

## 2021-08-12 MED ORDER — CEFDINIR 300 MG PO CAPS: 300.0000 mg | ORAL_CAPSULE | Freq: Two times a day (BID) | ORAL | 0 refills | Status: AC

## 2021-08-12 MED ORDER — METHOTREXATE 2.5 MG PO TABS: 15.0000 mg | ORAL_TABLET | ORAL | 0 refills | Status: AC

## 2021-08-12 MED ORDER — ALBUTEROL SULFATE HFA 108 (90 BASE) MCG/ACT IN AERS: 2.0000 | INHALATION_SPRAY | Freq: Four times a day (QID) | RESPIRATORY_TRACT | 1 refills | Status: DC | PRN

## 2021-08-12 MED ORDER — GUAIFENESIN ER 600 MG PO TB12: 600.0000 mg | ORAL_TABLET | Freq: Two times a day (BID) | ORAL | 0 refills | Status: AC

## 2021-08-12 MED ORDER — PREDNISONE 50 MG PO TABS: 50.0000 mg | ORAL_TABLET | Freq: Every day | ORAL | 0 refills | Status: DC

## 2021-08-12 MED ORDER — HEPARIN SODIUM (PORCINE) 5000 UNIT/ML IJ SOLN
5000.0000 [IU] | Freq: Three times a day (TID) | INTRAMUSCULAR | Status: DC
  Administered 2021-08-12: 5000 [IU] via SUBCUTANEOUS
  Filled 2021-08-12: qty 1

## 2021-08-12 MED ORDER — TRELEGY ELLIPTA 100-62.5-25 MCG/ACT IN AEPB: 1.0000 | INHALATION_SPRAY | Freq: Every morning | RESPIRATORY_TRACT | 1 refills | Status: DC

## 2021-08-12 MED ORDER — OSELTAMIVIR PHOSPHATE 30 MG PO CAPS: 30.0000 mg | ORAL_CAPSULE | Freq: Two times a day (BID) | ORAL | 0 refills | Status: AC

## 2021-08-12 MED ORDER — AZITHROMYCIN 250 MG PO TABS: 250.0000 mg | ORAL_TABLET | Freq: Every day | ORAL | 0 refills | Status: DC

## 2021-08-12 NOTE — Progress Notes (Signed)
Pt admitted from ED and upon assessment pt had an O2 sat of 94% on room air. MD notified and orders placed for walking O2 screen. Pt is being discharged home. Discharge instructions and medication education provided to pt.

## 2021-08-12 NOTE — Care Management Obs Status (Signed)
MEDICARE OBSERVATION STATUS NOTIFICATION   Patient Details  Name: Harry Lucas MRN: [id number] Date of Birth: [birth year]   Medicare Observation Status Notification Given:  Waylan Boga, RN 08/12/2021, 5:26 PM

## 2021-08-12 NOTE — Progress Notes (Signed)
SATURATION QUALIFICATIONS: (This note is used to comply with regulatory documentation for home oxygen)    Patient Saturations on Room Air at Rest = 94%  Patient Saturations on Room Air while Ambulating = 92%  Patient Saturations on 0 Liters of oxygen while Ambulating = 92%  Please briefly explain why patient needs home oxygen: 

## 2021-08-12 NOTE — Progress Notes (Signed)
PROGRESS NOTE  Harry Lucas BPZ:[id number] DOB: 1946/07/28   PCP: Adrian Prince, MD  Patient is from: Home.  Lives with his family.  Independently ambulates at baseline.  DOA: 08/11/2021 LOS: 1  Chief complaints:  Chief Complaint  Patient presents with   Shortness of Breath     Brief Narrative / Interim history: 76 year old M with PMH of COPD, OSA on CPAP, CAD, hypothyroidism, hypertension, hyperlipidemia, depression and former smoker directed to ED from urgent care due to hypoxemia in the setting of influenza A infection and admitted for severe sepsis with acute respiratory failure with hypoxia and COPD exacerbation due to influenza A infection and community-acquired pneumonia.  He presented to urgent care with flulike symptoms for 3 days.  Patient tested positive for influenza A at local urgent care.  Desaturated to 87% on RA and started on 3 L.  CXR with bibasilar air opacities.  Pro-Cal slightly elevated to 0.25.  He was started on BiPAP, CTX, azithromycin, systemic steroid, Tamiflu and nebulizers.  Subjective: Seen and examined earlier this morning.  No major events overnight of this morning.  Reports significant improvement in his symptoms.  He denies shortness of breath but productive cough with whitish phlegm.  He denies hemoptysis.  Denies GI or UTI symptoms.  Desaturated to 87% with ambulation on room air.  Objective: Vitals:   08/12/21 0817 08/12/21 0818 08/12/21 0823 08/12/21 1000  BP:   112/69 116/64  Pulse: 75 71 70 98  Resp: (!) 23 19 (!) 22 14  Temp:      TempSrc:      SpO2: 95% 95% 94% 94%  Weight:      Height:        Examination:  GENERAL: No apparent distress.  Nontoxic. HEENT: MMM.  Vision and hearing grossly intact.  NECK: Supple.  No apparent JVD.  RESP: 93% on RA at rest.  No IWOB.  Rhonchi bilaterally. CVS:  RRR. Heart sounds normal.  ABD/GI/GU: BS+. Abd soft, NTND.  MSK/EXT:  Moves extremities. No apparent deformity. No edema.  SKIN: no  apparent skin lesion or wound NEURO: Awake, alert and oriented appropriately.  No apparent focal neuro deficit. PSYCH: Calm. Normal affect.   Procedures:  None  Microbiology summarized: Influenza A positive at urgent care on day of admission COVID-19 and influenza PCR negative here Full RVP panel negative Blood cultures, MRSA PCR screen, sputum culture and GIP pending.  Assessment & Plan: Severe sepsis with acute hypoxic respiratory failure due to community-acquired pneumonia and influenza A infection in immunocompromised patient: POA.  Patient is on methotrexate for eczema.  Culture data as above.  Pro-Cal slightly elevated to 0.25. -Continue ceftriaxone, azithromycin, Tamiflu, systemic steroid and breathing treatments. -Follow MRSA PCR, blood cultures and sputum cultures. -Wean oxygen as able -Incentive spirometry, ambulatory saturation, OOB, PT/OT  COPD exacerbation: Likely due to the above.  70-pack-year history.  Quit smoking about a year and half ago. -Continue systemic steroid, antibiotics and breathing treatments as above -Continue home Singulair.  AKI: Could have some degree of CKD.  Unknown baseline.  Improving. Recent Labs    08/11/21 1745 08/12/21 0430  BUN 22 20  CREATININE 1.83* 1.57*  -Avoid nephrotoxic meds -Continue monitoring  Hyponatremia: Likely due to #1.  Resolved.  Elevated D-dimer: Low suspicion for VTE clinically.  Respiratory symptoms likely from infectious process and improving.  Risk of CTA with contrast outweighs benefit.  -Check LE [place].  History of CAD: No anginal symptoms. -Continue home statin  Essential hypertension: Normotensive.  Not on antihypertensive meds.  History of depression: Stable. -Continue home Prozac  OSA on CPAP -Continue nightly CPAP  History of eczema -Hold methotrexate in the setting of pneumonia.  Hypothyroidism -Continue home Synthroid.  Leukocytosis/bandemia: Likely due to infection.  Steroid could  contribute. -Continue monitoring  Body mass index is 26.63 kg/m.         DVT prophylaxis:  heparin injection 5,000 Units Start: 08/12/21 1400 SCDs Start: 08/11/21 2146  Code Status: Full code Family Communication: Updated patient's wife over the phone. Level of care: Telemetry Status is: Inpatient  Remains inpatient appropriate because: Severe sepsis and acute respiratory failure with hypoxia requiring IV antibiotics, IV steroid.  Patient is not on oxygen at baseline.   Final disposition: Likely home in the next 24 hours.    Consultants:  None   Sch Meds:  Scheduled Meds:  aspirin EC  81 mg Oral Daily   guaiFENesin  600 mg Oral BID   heparin injection (subcutaneous)  5,000 Units Subcutaneous Q8H   ipratropium  0.5 mg Nebulization Q6H   levalbuterol  0.63 mg Nebulization Q6H   mometasone-formoterol  2 puff Inhalation BID   montelukast  10 mg Oral QHS   oseltamivir  30 mg Oral BID   [START ON 08/13/2021] predniSONE  40 mg Oral Q breakfast   simvastatin  40 mg Oral q1800   sodium chloride flush  3 mL Intravenous Q12H   Continuous Infusions:  azithromycin     cefTRIAXone (ROCEPHIN)  IV     PRN Meds:.acetaminophen **OR** acetaminophen, albuterol, albuterol, HYDROcodone-acetaminophen, menthol-cetylpyridinium  Antimicrobials: Anti-infectives (From admission, onward)    Start     Dose/Rate Route Frequency Ordered Stop   08/12/21 1900  cefTRIAXone (ROCEPHIN) 2 g in sodium chloride 0.9 % 100 mL IVPB        2 g 200 mL/hr over 30 Minutes Intravenous Every 24 hours 08/11/21 1929 08/17/21 1859   08/12/21 1900  azithromycin (ZITHROMAX) 500 mg in sodium chloride 0.9 % 250 mL IVPB        500 mg 250 mL/hr over 60 Minutes Intravenous Every 24 hours 08/11/21 1929 08/17/21 1859   08/12/21 1000  oseltamivir (TAMIFLU) capsule 30 mg        30 mg Oral 2 times daily 08/11/21 2124 08/16/21 2159   08/11/21 2200  oseltamivir (TAMIFLU) capsule 75 mg  Status:  Discontinued        75 mg  Oral 2 times daily 08/11/21 1930 08/11/21 2123   08/11/21 2200  oseltamivir (TAMIFLU) capsule 75 mg        75 mg Oral  Once 08/11/21 2124 08/12/21 0040   08/11/21 1830  cefTRIAXone (ROCEPHIN) 1 g in sodium chloride 0.9 % 100 mL IVPB        1 g 200 mL/hr over 30 Minutes Intravenous  Once 08/11/21 1829 08/11/21 1947   08/11/21 1830  azithromycin (ZITHROMAX) 500 mg in sodium chloride 0.9 % 250 mL IVPB        500 mg 250 mL/hr over 60 Minutes Intravenous  Once 08/11/21 1829 08/11/21 2219        I have personally reviewed the following labs and images: CBC: Recent Labs  Lab 08/11/21 1745 08/12/21 0430  WBC 15.9* 15.3*  NEUTROABS 13.6* 14.5*  HGB 12.8* 11.5*  HCT 38.7* 34.9*  MCV 102.1* 101.5*  PLT 275 246   BMP &GFR Recent Labs  Lab 08/11/21 1745 08/11/21 2034 08/12/21 0430  NA 132*  --  136  K 3.9  --  4.3  CL 100  --  104  CO2 24  --  22  GLUCOSE 113*  --  155*  BUN 22  --  20  CREATININE 1.83*  --  1.57*  CALCIUM 8.8*  --  8.9  MG  --  2.3 2.3  PHOS  --  3.7 2.8   Estimated Creatinine Clearance: 36.8 mL/min (A) (by C-G formula based on SCr of 1.57 mg/dL (H)). Liver & Pancreas: Recent Labs  Lab 08/11/21 2034 08/12/21 0430  AST 20 22  ALT 29 31  ALKPHOS 51 55  BILITOT 0.7 0.7  PROT 7.2 7.3  ALBUMIN 3.2* 3.1*   No results for input(s): LIPASE, AMYLASE in the last 168 hours. No results for input(s): AMMONIA in the last 168 hours. Diabetic: No results for input(s): HGBA1C in the last 72 hours. No results for input(s): GLUCAP in the last 168 hours. Cardiac Enzymes: Recent Labs  Lab 08/11/21 2034  CKTOTAL 169   No results for input(s): PROBNP in the last 8760 hours. Coagulation Profile: No results for input(s): INR, PROTIME in the last 168 hours. Thyroid Function Tests: Recent Labs    08/12/21 0430  TSH 0.620   Lipid Profile: No results for input(s): CHOL, HDL, LDLCALC, TRIG, CHOLHDL, LDLDIRECT in the last 72 hours. Anemia Panel: No results for  input(s): VITAMINB12, FOLATE, FERRITIN, TIBC, IRON, RETICCTPCT in the last 72 hours. Urine analysis:    Component Value Date/Time   COLORURINE YELLOW 08/11/2021 1930   APPEARANCEUR HAZY (A) 08/11/2021 1930   LABSPEC 1.014 08/11/2021 1930   PHURINE 5.0 08/11/2021 1930   GLUCOSEU NEGATIVE 08/11/2021 1930   HGBUR MODERATE (A) 08/11/2021 1930   BILIRUBINUR NEGATIVE 08/11/2021 1930   KETONESUR 5 (A) 08/11/2021 1930   PROTEINUR 30 (A) 08/11/2021 1930   NITRITE NEGATIVE 08/11/2021 1930   LEUKOCYTESUR NEGATIVE 08/11/2021 1930   Sepsis Labs: Invalid input(s): PROCALCITONIN, LACTICIDVEN  Microbiology: Recent Results (from the past 240 hour(s))  Resp Panel by RT-PCR (Flu A&B, Covid) Nasopharyngeal Swab     Status: None   Collection Time: 08/11/21  5:23 PM   Specimen: Nasopharyngeal Swab; Nasopharyngeal(NP) swabs in vial transport medium  Result Value Ref Range Status   SARS Coronavirus 2 by RT PCR NEGATIVE NEGATIVE Final    Comment: (NOTE) SARS-CoV-2 target nucleic acids are NOT DETECTED.  The SARS-CoV-2 RNA is generally detectable in upper respiratory specimens during the acute phase of infection. The lowest concentration of SARS-CoV-2 viral copies this assay can detect is 138 copies/mL. A negative result does not preclude SARS-Cov-2 infection and should not be used as the sole basis for treatment or other patient management decisions. A negative result may occur with  improper specimen collection/handling, submission of specimen other than nasopharyngeal swab, presence of viral mutation(s) within the areas targeted by this assay, and inadequate number of viral copies(<138 copies/mL). A negative result must be combined with clinical observations, patient history, and epidemiological information. The expected result is Negative.  Fact Sheet for Patients:  BloggerCourse.com  Fact Sheet for Healthcare Providers:   SeriousBroker.it  This test is no t yet approved or cleared by the [place] FDA and  has been authorized for detection and/or diagnosis of SARS-CoV-2 by FDA under an Emergency Use Authorization (EUA). This EUA will remain  in effect (meaning this test can be used) for the duration of the COVID-19 declaration under Section 564(b)(1) of the Act, 21 U.S.C.section 360bbb-3(b)(1), unless the authorization is terminated  or revoked sooner.  Influenza A by PCR NEGATIVE NEGATIVE Final   Influenza B by PCR NEGATIVE NEGATIVE Final    Comment: (NOTE) The Xpert Xpress SARS-CoV-2/FLU/RSV plus assay is intended as an aid in the diagnosis of influenza from Nasopharyngeal swab specimens and should not be used as a sole basis for treatment. Nasal washings and aspirates are unacceptable for Xpert Xpress SARS-CoV-2/FLU/RSV testing.  Fact Sheet for Patients: BloggerCourse.comhttps://www.fda.gov/media/152166/download  Fact Sheet for Healthcare Providers: SeriousBroker.ithttps://www.fda.gov/media/152162/download  This test is not yet approved or cleared by the [place] FDA and has been authorized for detection and/or diagnosis of SARS-CoV-2 by FDA under an Emergency Use Authorization (EUA). This EUA will remain in effect (meaning this test can be used) for the duration of the COVID-19 declaration under Section 564(b)(1) of the Act, 21 U.S.C. section 360bbb-3(b)(1), unless the authorization is terminated or revoked.  Performed at [facility], [address] [place], [place] [address]   Respiratory (~20 pathogens) panel by PCR     Status: None   Collection Time: 08/11/21  9:19 PM   Specimen: Nasopharyngeal Swab; Respiratory  Result Value Ref Range Status   Adenovirus NOT DETECTED NOT DETECTED Final   Coronavirus 229E NOT DETECTED NOT DETECTED Final    Comment: (NOTE) The Coronavirus on the Respiratory Panel, DOES NOT test for the novel  Coronavirus (2019 nCoV)     Coronavirus HKU1 NOT DETECTED NOT DETECTED Final   Coronavirus NL63 NOT DETECTED NOT DETECTED Final   Coronavirus OC43 NOT DETECTED NOT DETECTED Final   Metapneumovirus NOT DETECTED NOT DETECTED Final   Rhinovirus / Enterovirus NOT DETECTED NOT DETECTED Final   Influenza A NOT DETECTED NOT DETECTED Final   Influenza B NOT DETECTED NOT DETECTED Final   Parainfluenza Virus 1 NOT DETECTED NOT DETECTED Final   Parainfluenza Virus 2 NOT DETECTED NOT DETECTED Final   Parainfluenza Virus 3 NOT DETECTED NOT DETECTED Final   Parainfluenza Virus 4 NOT DETECTED NOT DETECTED Final   Respiratory Syncytial Virus NOT DETECTED NOT DETECTED Final   Bordetella pertussis NOT DETECTED NOT DETECTED Final   Bordetella Parapertussis NOT DETECTED NOT DETECTED Final   Chlamydophila pneumoniae NOT DETECTED NOT DETECTED Final   Mycoplasma pneumoniae NOT DETECTED NOT DETECTED Final    Comment: Performed at [facility], [address] [place], [place] [address]    Radiology Studies: DG Chest 2 View  Result Date: 08/11/2021 CLINICAL DATA:  Shob. Associated narrowing of the right mainstem bronchus as well as distal trachea due to external mass effect. EXAM: CHEST - 2 VIEW COMPARISON:  None. FINDINGS: The heart and mediastinal contours are within normal limits. Aortic calcification. Biapical pleural/pulmonary scarring. Emphysematous changes. Left base streaky airspace opacities. Right lower lobe interstitial and airspace opacities. No pulmonary edema. No pleural effusion. No pneumothorax. No acute osseous abnormality. IMPRESSION: 1. Bibasilar airspace opacities suggestive of infection/inflammation. 2. Aortic Atherosclerosis (ICD10-I70.0) and Emphysema (ICD10-J43.9). Electronically Signed   By: Tish FredericksonMorgane  Naveau M.D.   On: 08/11/2021 17:24   DG Abd 1 View  Result Date: 08/11/2021 CLINICAL DATA:  Abdominal distension.  Diarrhea. EXAM: ABDOMEN - 1 VIEW COMPARISON:  None. FINDINGS: The bowel gas pattern is normal. No  radio-opaque calculi or other significant radiographic abnormality are seen. IMPRESSION: Negative. Electronically Signed   By: Darliss CheneyAmy  Guttmann M.D.   On: 08/11/2021 22:11      Malika Demario T. Soul Hackman [facility]  If 7PM-7AM, please contact night-coverage [email or web address] 08/12/2021, 11:39 AM

## 2021-08-12 NOTE — Progress Notes (Signed)
Lower extremity venous bilateral study completed.   Please see CV Proc for preliminary results.   Aidyn Sportsman, RDMS, RVT  

## 2021-08-12 NOTE — Care Management CC44 (Signed)
Condition Code 44 Documentation Completed  Patient Details  Name: Harry Lucas MRN: [id number] Date of Birth: 04/03/1946   Condition Code 44 given:  Yes Patient signature on Condition Code 44 notice:  Yes Documentation of 2 MD's agreement:  Yes Code 44 added to claim:  Yes    Michel Bickers, RN 08/12/2021, 5:26 PM

## 2021-08-12 NOTE — ED Notes (Signed)
Saturation at rest= 95% Saturation with ambulation= 87% Minimum oxygen needed to keep saturation above 88% with ambulation= 2L 95%

## 2021-08-12 NOTE — ED Notes (Signed)
Pt weaned off O2 and on room air.

## 2021-08-12 NOTE — Assessment & Plan Note (Signed)
Obtain gastric panel

## 2021-08-12 NOTE — ED Notes (Signed)
2nd IV placed in 4 hours due to patient accidentally removing Ivs. Pt educated on IV patency. IV wrapped in coband so it stays secure.

## 2021-08-12 NOTE — Discharge Summary (Signed)
Physician Discharge Summary  Harry Lucas ZOX:[id number] DOB: 10/09/1945 DOA: 08/11/2021  PCP: Adrian PrinceSouth, Stephen, MD  Admit date: 08/11/2021 Discharge date: 08/12/2021 Admitted From: Home Disposition: Home Recommendations for Outpatient Follow-up:  Follow ups as below. Please obtain CBC/BMP/Mag at follow up Please follow up on the following pending results: None Home Health: Not indicated Equipment/Devices: Not indicated Discharge Condition: Stable CODE STATUS: Full code  Follow-up Information     Adrian PrinceSouth, Stephen, MD. Schedule an appointment as soon as possible for a visit in 1 week(s).   Specialty: Endocrinology Contact information: [address] [place] [address] [phone]                Hospital Course: 76 year old M with PMH of COPD, OSA on CPAP, CAD, hypothyroidism, hypertension, hyperlipidemia, depression and former smoker directed to ED from urgent care due to hypoxemia in the setting of influenza A infection and admitted for severe sepsis with acute respiratory failure with hypoxia and COPD exacerbation due to influenza A infection and community-acquired pneumonia.  He presented to urgent care with flulike symptoms for 3 days.  Patient tested positive for influenza A at local urgent care.  However, his COVID-19, influenza PCR and full RVP panel were negative in ED.  He desaturated to 87% on RA and started on 3 L. CXR with bibasilar air opacities.  Pro-Cal slightly elevated to 0.25.  He was started on CTX, azithromycin, systemic steroid, Tamiflu and nebulizers.  The next day, patient improved tremendously.  Maintain saturation above 92% with ambulation on room air.  He felt well and requested discharge.  He is discharged on p.o. prednisone, cefdinir, azithromycin and Tamiflu for 3 more days.  Renewed his prescriptions for Trelegy Ellipta and albuterol inhaler as well.  Advised to follow-up with PCP in 1 to 2 weeks or sooner.  See individual problem list below  for more on hospital course.  Discharge Diagnoses:  Severe sepsis with acute hypoxic respiratory failure due to community-acquired pneumonia and influenza A infection in immunocompromised patient: POA.  Tested positive for influenza A at local urgent care.  Full RVP  and blood culture negative.  Patient is on methotrexate for eczema.  Pro-Cal slightly elevated to 0.25.  Sepsis physiology resolved. -Discharged on antibiotics, steroid, Tamiflu and breathing treatment as above   COPD exacerbation: Likely due to the above.  70-pack-year history.  Quit smoking about a year and half ago. -Discharged on steroid, antibiotics and inhalers as above   AKI: Could have some degree of CKD.  Unknown baseline.  Improved. Recent Labs (within last 365 days)      Recent Labs    08/11/21 1745 08/12/21 0430  BUN 22 20  CREATININE 1.83* 1.57*    -Recheck renal function in 1 week   Hyponatremia: Likely due to #1.  Resolved.   Elevated D-dimer: Respiratory symptoms likely from infectious process and improved.  LE US negative for DVT.  Low suspicion for PE clinically.   History of CAD: No anginal symptoms. -Continue home statin   Essential hypertension: Normotensive.  Not on antihypertensive meds.   History of depression: Stable. -Continue home Prozac   OSA on CPAP -Continue nightly CPAP   History of eczema -Hold methotrexate in the setting of pneumonia.   Hypothyroidism -Continue home Synthroid.   Leukocytosis/bandemia: Likely due to infection.  Steroid could contribute. -Recheck CBC at follow-up.  Body mass index is 26.63 kg/m.           Discharge Exam: Vitals:   08/12/21 1300 08/12/21  1424 08/12/21 1536 08/12/21 1550  BP: 116/64  (!) 124/59 117/68  Pulse: 70  78 78  Temp:  97.6 F (36.4 C)  98.3 F (36.8 C)  Resp: (!) 21  19 20   Height:      Weight:      SpO2: 94%  95% 94%  TempSrc:  Oral  Oral  BMI (Calculated):         GENERAL: No apparent distress.  Nontoxic. HEENT:  MMM.  Vision and hearing grossly intact.  NECK: Supple.  No apparent JVD.  RESP: 94% on RA.  No IWOB.  Fair aeration bilaterally. CVS:  RRR. Heart sounds normal.  ABD/GI/GU: Bowel sounds present. Soft. Non tender.  MSK/EXT:  Moves extremities. No apparent deformity. No edema.  SKIN: no apparent skin lesion or wound NEURO: Awake and alert.  Oriented appropriately.  No apparent focal neuro deficit. PSYCH: Calm. Normal affect.   Discharge Instructions  Discharge Instructions     Call MD for:  difficulty breathing, headache or visual disturbances   Complete by: As directed    Call MD for:  extreme fatigue   Complete by: As directed    Call MD for:  persistant dizziness or light-headedness   Complete by: As directed    Call MD for:  persistant nausea and vomiting   Complete by: As directed    Call MD for:  severe uncontrolled pain   Complete by: As directed    Diet general   Complete by: As directed    Discharge instructions   Complete by: As directed    It has been a pleasure taking care of you!  You were hospitalized with shortness of breath, cough, diarrhea and generalized body pain due to influenza A infection, pneumonia and COPD exacerbation.  Your symptoms improved with treatment.  We are discharging you more antibiotics for pneumonia, Tamiflu for influenza and prednisone and breathing treatment for COPD exacerbation.  Please hold your methotrexate until you complete antibiotic course and recover fully.  Review your new medication list and the directions on your medications before you take them.  Follow-up with your primary care doctor in 1 to 2 weeks or sooner if needed.   Take care,   Increase activity slowly   Complete by: As directed       Allergies as of 08/12/2021   No Known Allergies      Medication List     TAKE these medications    acetaminophen 500 MG tablet Commonly known as: TYLENOL Take 1,000 mg by mouth every 6 (six) hours as needed for headache  (pain).   albuterol 108 (90 Base) MCG/ACT inhaler Commonly known as: VENTOLIN HFA Inhale 2 puffs into the lungs every 6 (six) hours as needed for wheezing or shortness of breath. What changed: when to take this   azithromycin 250 MG tablet Commonly known as: Zithromax Take 1 tablet (250 mg total) by mouth daily. Start taking on: August 13, 2021   betamethasone dipropionate 0.05 % lotion Apply 1 application topically daily as needed (psoriasis/eczema).   cefdinir 300 MG capsule Commonly known as: OMNICEF Take 1 capsule (300 mg total) by mouth 2 (two) times daily for 4 days. Start taking on: August 13, 2021   cholecalciferol 25 MCG (1000 UNIT) tablet Commonly known as: VITAMIN D Take 1,000 Units by mouth every morning.   FLUoxetine 40 MG capsule Commonly known as: PROZAC Take 40 mg by mouth every morning.   folic acid 1 MG tablet Commonly known as:  FOLVITE Take 1 mg by mouth every morning.   guaiFENesin 600 MG 12 hr tablet Commonly known as: MUCINEX Take 1 tablet (600 mg total) by mouth 2 (two) times daily for 5 days.   hydrocortisone 2.5 % cream Apply 1 application topically 2 (two) times daily as needed (psoriasis/eczema).   levothyroxine 150 MCG tablet Commonly known as: SYNTHROID Take 150 mcg by mouth daily before breakfast.   methotrexate 2.5 MG tablet Commonly known as: RHEUMATREX Take 6 tablets (15 mg total) by mouth every Sunday. Start taking on: August 19, 2021   montelukast 10 MG tablet Commonly known as: SINGULAIR Take 10 mg by mouth every morning.   oseltamivir 30 MG capsule Commonly known as: TAMIFLU Take 1 capsule (30 mg total) by mouth 2 (two) times daily for 3 days. Start taking on: August 13, 2021   predniSONE 50 MG tablet Commonly known as: DELTASONE Take 1 tablet (50 mg total) by mouth daily with breakfast. Start taking on: August 13, 2021   simvastatin 40 MG tablet Commonly known as: ZOCOR Take 40 mg by mouth every morning.   Trelegy  Ellipta 100-62.5-25 MCG/ACT Aepb Generic drug: Fluticasone-Umeclidin-Vilant Inhale 1 puff into the lungs every morning.   vitamin B-12 1000 MCG tablet Commonly known as: CYANOCOBALAMIN Take 1,000 mcg by mouth every morning.        Consultations: None  Procedures/Studies:   DG Chest 2 View  Result Date: 08/11/2021 CLINICAL DATA:  Shob. Associated narrowing of the right mainstem bronchus as well as distal trachea due to external mass effect. EXAM: CHEST - 2 VIEW COMPARISON:  None. FINDINGS: The heart and mediastinal contours are within normal limits. Aortic calcification. Biapical pleural/pulmonary scarring. Emphysematous changes. Left base streaky airspace opacities. Right lower lobe interstitial and airspace opacities. No pulmonary edema. No pleural effusion. No pneumothorax. No acute osseous abnormality. IMPRESSION: 1. Bibasilar airspace opacities suggestive of infection/inflammation. 2. Aortic Atherosclerosis (ICD10-I70.0) and Emphysema (ICD10-J43.9). Electronically Signed   By: Tish Frederickson M.D.   On: 08/11/2021 17:24   DG Abd 1 View  Result Date: 08/11/2021 CLINICAL DATA:  Abdominal distension.  Diarrhea. EXAM: ABDOMEN - 1 VIEW COMPARISON:  None. FINDINGS: The bowel gas pattern is normal. No radio-opaque calculi or other significant radiographic abnormality are seen. IMPRESSION: Negative. Electronically Signed   By: Darliss Cheney M.D.   On: 08/11/2021 22:11   VAS [place] LOWER EXTREMITY VENOUS (DVT)  Result Date: 08/12/2021  Lower Venous DVT Study Patient Name:  ROHAIL KLEES  Date of Exam:   08/12/2021 Medical Rec #: [id number]          Accession #:    [id number] Date of Birth: 12/25/1945           Patient Gender: M Patient Age:   76 years Exam Location:  [facility] Procedure:      VAS [place] LOWER EXTREMITY VENOUS (DVT) Referring Phys: Alwyn Ren Mandee Pluta --------------------------------------------------------------------------------  Indications: D-dimer 1.01, SOB.  Comparison Study: No  prior studies. Performing Technologist: Jean Rosenthal RDMS, RVT  Examination Guidelines: A complete evaluation includes B-mode imaging, spectral Doppler, color Doppler, and power Doppler as needed of all accessible portions of each vessel. Bilateral testing is considered an integral part of a complete examination. Limited examinations for reoccurring indications may be performed as noted. The reflux portion of the exam is performed with the patient in reverse Trendelenburg.  +---------+---------------+---------+-----------+----------+--------------+  RIGHT     Compressibility Phasicity Spontaneity Properties Thrombus Aging  +---------+---------------+---------+-----------+----------+--------------+  CFV       Full  Yes       Yes                                    +---------+---------------+---------+-----------+----------+--------------+  SFJ       Full                                                             +---------+---------------+---------+-----------+----------+--------------+  FV Prox   Full                                                             +---------+---------------+---------+-----------+----------+--------------+  FV Mid    Full                                                             +---------+---------------+---------+-----------+----------+--------------+  FV Distal Full                                                             +---------+---------------+---------+-----------+----------+--------------+  PFV       Full                                                             +---------+---------------+---------+-----------+----------+--------------+  POP       Full            Yes       Yes                                    +---------+---------------+---------+-----------+----------+--------------+  PTV       Full                                                             +---------+---------------+---------+-----------+----------+--------------+  PERO      Full                                                              +---------+---------------+---------+-----------+----------+--------------+  Gastroc   Full                                                             +---------+---------------+---------+-----------+----------+--------------+   +---------+---------------+---------+-----------+----------+--------------+  LEFT      Compressibility Phasicity Spontaneity Properties Thrombus Aging  +---------+---------------+---------+-----------+----------+--------------+  CFV       Full            Yes       Yes                                    +---------+---------------+---------+-----------+----------+--------------+  SFJ       Full                                                             +---------+---------------+---------+-----------+----------+--------------+  FV Prox   Full                                                             +---------+---------------+---------+-----------+----------+--------------+  FV Mid    Full                                                             +---------+---------------+---------+-----------+----------+--------------+  FV Distal Full                                                             +---------+---------------+---------+-----------+----------+--------------+  PFV       Full                                                             +---------+---------------+---------+-----------+----------+--------------+  POP       Full            Yes       Yes                                    +---------+---------------+---------+-----------+----------+--------------+  PTV       Full                                                             +---------+---------------+---------+-----------+----------+--------------+  PERO      Full                                                             +---------+---------------+---------+-----------+----------+--------------+  Gastroc   Full                                                              +---------+---------------+---------+-----------+----------+--------------+     Summary: RIGHT: - There is no evidence of deep vein thrombosis in the lower extremity.  - No cystic structure found in the popliteal fossa.  LEFT: - There is no evidence of deep vein thrombosis in the lower extremity.  - No cystic structure found in the popliteal fossa.  *See table(s) above for measurements and observations. Electronically signed by Sherald Hess MD on 08/12/2021 at 2:13:53 PM.    Final        The results of significant diagnostics from this hospitalization (including imaging, microbiology, ancillary and laboratory) are listed below for reference.     Microbiology: Recent Results (from the past 240 hour(s))  Resp Panel by RT-PCR (Flu A&B, Covid) Nasopharyngeal Swab     Status: None   Collection Time: 08/11/21  5:23 PM   Specimen: Nasopharyngeal Swab; Nasopharyngeal(NP) swabs in vial transport medium  Result Value Ref Range Status   SARS Coronavirus 2 by RT PCR NEGATIVE NEGATIVE Final    Comment: (NOTE) SARS-CoV-2 target nucleic acids are NOT DETECTED.  The SARS-CoV-2 RNA is generally detectable in upper respiratory specimens during the acute phase of infection. The lowest concentration of SARS-CoV-2 viral copies this assay can detect is 138 copies/mL. A negative result does not preclude SARS-Cov-2 infection and should not be used as the sole basis for treatment or other patient management decisions. A negative result may occur with  improper specimen collection/handling, submission of specimen other than nasopharyngeal swab, presence of viral mutation(s) within the areas targeted by this assay, and inadequate number of viral copies(<138 copies/mL). A negative result must be combined with clinical observations, patient history, and epidemiological information. The expected result is Negative.  Fact Sheet for Patients:  BloggerCourse.com  Fact Sheet for  Healthcare Providers:  SeriousBroker.it  This test is no t yet approved or cleared by the [place] FDA and  has been authorized for detection and/or diagnosis of SARS-CoV-2 by FDA under an Emergency Use Authorization (EUA). This EUA will remain  in effect (meaning this test can be used) for the duration of the COVID-19 declaration under Section 564(b)(1) of the Act, 21 U.S.C.section 360bbb-3(b)(1), unless the authorization is terminated  or revoked sooner.       Influenza A by PCR NEGATIVE NEGATIVE Final   Influenza B by PCR NEGATIVE NEGATIVE Final    Comment: (NOTE) The Xpert Xpress SARS-CoV-2/FLU/RSV plus assay is intended as an aid in the diagnosis of influenza from Nasopharyngeal swab specimens and should not be used as a sole basis for treatment. Nasal washings and aspirates are unacceptable for Xpert Xpress SARS-CoV-2/FLU/RSV testing.  Fact Sheet for Patients: BloggerCourse.com  Fact Sheet for Healthcare Providers: SeriousBroker.it  This test is not yet approved or cleared by the [place] FDA and has been authorized for detection and/or diagnosis of SARS-CoV-2 by FDA under an Emergency Use Authorization (EUA). This EUA will remain in effect (meaning this test can be used) for the duration of the COVID-19 declaration under Section 564(b)(1) of the Act, 21 U.S.C. section 360bbb-3(b)(1), unless the authorization is terminated or revoked.  Performed at [facility], [address] [place], [place] [address]   Culture, blood (routine x 2) Call MD if unable to obtain prior to antibiotics being given     Status: None (Preliminary result)   Collection Time: 08/11/21  8:34 PM   Specimen: BLOOD  Result Value Ref Range Status   Specimen Description   Final    BLOOD Performed at [facility], [address] [place], [place] [address]    Special Requests    Final    BOTTLES DRAWN AEROBIC AND ANAEROBIC Blood Culture adequate volume Performed at [facility], [address], [place], [place] [address]    Culture   Final    NO GROWTH < 12 HOURS Performed at [facility], [address], [place], [place] [address]    Report Status PENDING  Incomplete  Culture, blood (routine x 2) Call MD if unable to obtain prior to antibiotics being given     Status: None (Preliminary result)   Collection Time: 08/11/21  8:34 PM   Specimen: BLOOD  Result Value Ref Range Status   Specimen Description   Final    BLOOD RIGHT WRIST Performed at [facility], [address], [place], [place] [address]    Special Requests   Final    BOTTLES DRAWN AEROBIC AND ANAEROBIC Blood Culture adequate volume Performed at [facility], [address] [place], [place] [address]    Culture   Final    NO GROWTH < 12 HOURS Performed at [facility], [address], [place], [place] [address]    Report Status PENDING  Incomplete  Respiratory (~20 pathogens) panel by PCR     Status: None   Collection Time: 08/11/21  9:19 PM   Specimen: Nasopharyngeal Swab; Respiratory  Result Value Ref Range Status   Adenovirus NOT DETECTED NOT DETECTED Final   Coronavirus 229E NOT DETECTED NOT DETECTED Final    Comment: (NOTE) The Coronavirus on the Respiratory Panel, DOES NOT test for the novel  Coronavirus (2019 nCoV)    Coronavirus HKU1 NOT DETECTED NOT DETECTED Final   Coronavirus NL63 NOT DETECTED NOT DETECTED Final   Coronavirus OC43 NOT DETECTED NOT DETECTED Final   Metapneumovirus NOT DETECTED NOT DETECTED Final   Rhinovirus / Enterovirus NOT DETECTED NOT DETECTED Final   Influenza A NOT DETECTED NOT DETECTED Final   Influenza B NOT DETECTED NOT DETECTED Final   Parainfluenza Virus 1 NOT DETECTED NOT DETECTED Final   Parainfluenza Virus 2 NOT DETECTED NOT DETECTED Final   Parainfluenza Virus 3 NOT DETECTED  NOT DETECTED Final   Parainfluenza Virus 4 NOT DETECTED NOT DETECTED Final   Respiratory Syncytial Virus NOT DETECTED NOT DETECTED Final   Bordetella pertussis NOT DETECTED NOT DETECTED Final   Bordetella Parapertussis NOT DETECTED NOT DETECTED Final   Chlamydophila pneumoniae NOT DETECTED NOT DETECTED Final   Mycoplasma pneumoniae NOT DETECTED NOT DETECTED Final    Comment: Performed at [facility], [address] [place], [place] [address]     Labs:  CBC: Recent Labs  Lab 08/11/21 1745 08/12/21 0430  WBC 15.9* 15.3*  NEUTROABS 13.6* 14.5*  HGB 12.8* 11.5*  HCT 38.7* 34.9*  MCV 102.1* 101.5*  PLT 275 246   BMP &GFR Recent Labs  Lab 08/11/21 1745 08/11/21 2034 08/12/21 0430  NA 132*  --  136  K 3.9  --  4.3  CL 100  --  104  CO2 24  --  22  GLUCOSE 113*  --  155*  BUN 22  --  20  CREATININE 1.83*  --  1.57*  CALCIUM 8.8*  --  8.9  MG  --  2.3 2.3  PHOS  --  3.7 2.8   Estimated Creatinine Clearance: 36.8 mL/min (A) (by C-G formula based on SCr of 1.57 mg/dL (H)). Liver & Pancreas: Recent Labs  Lab 08/11/21 2034 08/12/21 0430  AST 20 22  ALT 29 31  ALKPHOS 51 55  BILITOT 0.7 0.7  PROT 7.2 7.3  ALBUMIN 3.2* 3.1*   No results for input(s): LIPASE, AMYLASE in the last 168 hours. No results for input(s): AMMONIA in the last 168 hours. Diabetic: No results for input(s): HGBA1C in the last 72 hours. No results for input(s): GLUCAP in the last 168 hours. Cardiac Enzymes: Recent Labs  Lab 08/11/21 2034  CKTOTAL 169   No results for input(s): PROBNP in the last 8760 hours. Coagulation Profile: No results for input(s): INR, PROTIME in the last 168 hours. Thyroid Function Tests: Recent Labs    08/12/21 0430  TSH 0.620   Lipid Profile: No results for input(s): CHOL, HDL, LDLCALC, TRIG, CHOLHDL, LDLDIRECT in the last 72 hours. Anemia Panel: No results for input(s): VITAMINB12, FOLATE, FERRITIN, TIBC, IRON, RETICCTPCT in the last 72 hours. Urine  analysis:    Component Value Date/Time   COLORURINE YELLOW 08/11/2021 1930   APPEARANCEUR HAZY (A) 08/11/2021 1930   LABSPEC 1.014 08/11/2021 1930   PHURINE 5.0 08/11/2021 1930   GLUCOSEU NEGATIVE 08/11/2021 1930   HGBUR MODERATE (A) 08/11/2021 1930   BILIRUBINUR NEGATIVE 08/11/2021 1930   KETONESUR 5 (A) 08/11/2021 1930   PROTEINUR 30 (A) 08/11/2021 1930   NITRITE NEGATIVE 08/11/2021 1930   LEUKOCYTESUR NEGATIVE 08/11/2021 1930   Sepsis Labs: Invalid input(s): PROCALCITONIN, LACTICIDVEN   Time coordinating discharge: 55 minutes  SIGNED:  Almon Hercules, MD  [facility] 08/12/2021, 6:21 PM

## 2021-08-12 NOTE — Progress Notes (Signed)
Pt refused ABG. RN notified. No distress noted at this time. Pt on RA vitals normal.

## 2021-08-13 LAB — LEGIONELLA PNEUMOPHILA SEROGP 1 UR AG: L. pneumophila Serogp 1 Ur Ag: NEGATIVE

## 2021-08-17 LAB — CULTURE, BLOOD (ROUTINE X 2)
Culture: NO GROWTH
Culture: NO GROWTH
Special Requests: ADEQUATE
Special Requests: ADEQUATE

## 2021-08-28 ENCOUNTER — Other Ambulatory Visit: Payer: Self-pay

## 2021-08-28 ENCOUNTER — Other Ambulatory Visit (HOSPITAL_COMMUNITY): Payer: Self-pay | Admitting: Endocrinology

## 2021-08-28 ENCOUNTER — Other Ambulatory Visit: Payer: Self-pay | Admitting: Endocrinology

## 2021-08-28 ENCOUNTER — Ambulatory Visit (HOSPITAL_BASED_OUTPATIENT_CLINIC_OR_DEPARTMENT_OTHER)
Admission: RE | Admit: 2021-08-28 | Discharge: 2021-08-28 | Disposition: A | Discharge status: Home / Self Care | Payer: Medicare Other | Source: Ambulatory Visit | Attending: Endocrinology | Admitting: Endocrinology

## 2021-08-28 DIAGNOSIS — R0602 Shortness of breath: Secondary | ICD-10-CM

## 2021-08-28 DIAGNOSIS — R7989 Other specified abnormal findings of blood chemistry: Secondary | ICD-10-CM

## 2021-08-28 MED ORDER — IOHEXOL 350 MG/ML SOLN
75.0000 mL | Freq: Once | INTRAVENOUS | Status: AC | PRN
  Administered 2021-08-28: 75 mL via INTRAVENOUS

## 2021-09-01 ENCOUNTER — Encounter (HOSPITAL_BASED_OUTPATIENT_CLINIC_OR_DEPARTMENT_OTHER): Payer: Self-pay

## 2021-09-01 ENCOUNTER — Ambulatory Visit (HOSPITAL_BASED_OUTPATIENT_CLINIC_OR_DEPARTMENT_OTHER): Payer: Medicare Other

## 2021-09-10 ENCOUNTER — Other Ambulatory Visit: Payer: Self-pay

## 2021-09-10 ENCOUNTER — Encounter: Payer: Self-pay | Admitting: Pulmonary Disease

## 2021-09-10 ENCOUNTER — Ambulatory Visit: Payer: Medicare Other | Admitting: Pulmonary Disease

## 2021-09-10 VITALS — BP 110/64 | HR 112 | Temp 98.7°F | Ht 66.0 in | Wt 170.4 lb

## 2021-09-10 DIAGNOSIS — R918 Other nonspecific abnormal finding of lung field: Secondary | ICD-10-CM | POA: Diagnosis not present

## 2021-09-10 DIAGNOSIS — Z87891 Personal history of nicotine dependence: Secondary | ICD-10-CM | POA: Diagnosis not present

## 2021-09-10 DIAGNOSIS — J432 Centrilobular emphysema: Secondary | ICD-10-CM

## 2021-09-10 NOTE — Progress Notes (Signed)
Synopsis: Referred in Jan 2023 for lung nodule by Reynold Bowen, MD  Subjective:   PATIENT ID: Harry Lucas GENDER: male DOB: 07-29-1946, MRN: [id number]  Chief Complaint  Patient presents with   Consult    This is a 76 year old gentleman, past medical history of coronary disease.  Patient is a former smoker who quit in 2020.Patient had a CT scan of the chest on 08/28/2021, patient was found to have diffuse basilar predominant tree-in-bud nodularity new since previous CT imaging favoring an infectious bronchiolitis.  Has a few smaller bilateral pulmonary nodules.  Initial lung cancer screening CT was in January 2021.  Patient had a lung cancer screening CT then which was read as a lung RADS 2.  Was found to have small scattered pulmonary nodules largest of which was 5.7 mm.   Past Medical History:  Diagnosis Date   Coronary artery disease    Eczema      Family History  Problem Relation Age of Onset   Hypertension Other      No past surgical history on file.  Social History   Socioeconomic History   Marital status: Married    Spouse name: Not on file   Number of children: Not on file   Years of education: Not on file   Highest education level: Not on file  Occupational History   Not on file  Tobacco Use   Smoking status: Former    Types: Cigarettes    Quit date: 07/13/2019    Years since quitting: 2.1    Passive exposure: Past   Smokeless tobacco: Never  Vaping Use   Vaping Use: Never used  Substance and Sexual Activity   Alcohol use: Not Currently   Drug use: Never   Sexual activity: Not on file  Other Topics Concern   Not on file  Social History Narrative   Not on file   Social Determinants of Health   Financial Resource Strain: Not on file  Food Insecurity: Not on file  Transportation Needs: Not on file  Physical Activity: Not on file  Stress: Not on file  Social Connections: Not on file  Intimate Partner Violence: Not on file     No Known  Allergies   Outpatient Medications Prior to Visit  Medication Sig Dispense Refill   acetaminophen (TYLENOL) 500 MG tablet Take 1,000 mg by mouth every 6 (six) hours as needed for headache (pain).     albuterol (VENTOLIN HFA) 108 (90 Base) MCG/ACT inhaler Inhale 2 puffs into the lungs every 6 (six) hours as needed for wheezing or shortness of breath. 1 each 1   azithromycin (ZITHROMAX) 250 MG tablet Take 1 tablet (250 mg total) by mouth daily. 3 each 0   betamethasone dipropionate 0.05 % lotion Apply 1 application topically daily as needed (psoriasis/eczema).     cholecalciferol (VITAMIN D) 25 MCG (1000 UNIT) tablet Take 1,000 Units by mouth every morning.     FLUoxetine (PROZAC) 40 MG capsule Take 40 mg by mouth every morning.     Fluticasone-Umeclidin-Vilant (TRELEGY ELLIPTA) 100-62.5-25 MCG/ACT AEPB Inhale 1 puff into the lungs every morning. 1 each 1   folic acid (FOLVITE) 1 MG tablet Take 1 mg by mouth every morning.     hydrocortisone 2.5 % cream Apply 1 application topically 2 (two) times daily as needed (psoriasis/eczema).     levothyroxine (SYNTHROID) 150 MCG tablet Take 150 mcg by mouth daily before breakfast.     methotrexate (RHEUMATREX) 2.5 MG tablet Take 6 tablets (  15 mg total) by mouth every Sunday. 4 tablet 0   montelukast (SINGULAIR) 10 MG tablet Take 10 mg by mouth every morning.     predniSONE (DELTASONE) 50 MG tablet Take 1 tablet (50 mg total) by mouth daily with breakfast. 3 tablet 0   simvastatin (ZOCOR) 40 MG tablet Take 40 mg by mouth every morning.     vitamin B-12 (CYANOCOBALAMIN) 1000 MCG tablet Take 1,000 mcg by mouth every morning.     No facility-administered medications prior to visit.    Review of Systems  Constitutional:  Negative for chills, fever, malaise/fatigue and weight loss.  HENT:  Negative for hearing loss, sore throat and tinnitus.   Eyes:  Negative for blurred vision and double vision.  Respiratory:  Positive for shortness of breath. Negative for  cough, hemoptysis, sputum production, wheezing and stridor.   Cardiovascular:  Negative for chest pain, palpitations, orthopnea, leg swelling and PND.  Gastrointestinal:  Negative for abdominal pain, constipation, diarrhea, heartburn, nausea and vomiting.  Genitourinary:  Negative for dysuria, hematuria and urgency.  Musculoskeletal:  Negative for joint pain and myalgias.  Skin:  Negative for itching and rash.  Neurological:  Negative for dizziness, tingling, weakness and headaches.  Endo/Heme/Allergies:  Negative for environmental allergies. Does not bruise/bleed easily.  Psychiatric/Behavioral:  Negative for depression. The patient is not nervous/anxious and does not have insomnia.   All other systems reviewed and are negative.   Objective:  Physical Exam Vitals reviewed.  Constitutional:      General: He is not in acute distress.    Appearance: He is well-developed.  HENT:     Head: Normocephalic and atraumatic.  Eyes:     General: No scleral icterus.    Conjunctiva/sclera: Conjunctivae normal.     Pupils: Pupils are equal, round, and reactive to light.  Neck:     Vascular: No JVD.     Trachea: No tracheal deviation.  Cardiovascular:     Rate and Rhythm: Normal rate and regular rhythm.     Heart sounds: Normal heart sounds. No murmur heard. Pulmonary:     Effort: Pulmonary effort is normal. No tachypnea, accessory muscle usage or respiratory distress.     Breath sounds: No stridor. No wheezing, rhonchi or rales.     Comments: Diminished breath sounds bilaterally Abdominal:     General: There is no distension.     Palpations: Abdomen is soft.     Tenderness: There is no abdominal tenderness.  Musculoskeletal:        General: No tenderness.     Cervical back: Neck supple.  Lymphadenopathy:     Cervical: No cervical adenopathy.  Skin:    General: Skin is warm and dry.     Capillary Refill: Capillary refill takes less than 2 seconds.     Findings: No rash.  Neurological:      Mental Status: He is alert and oriented to person, place, and time.  Psychiatric:        Behavior: Behavior normal.     There were no vitals filed for this visit.   on RA BMI Readings from Last 3 Encounters:  08/11/21 26.63 kg/m   Wt Readings from Last 3 Encounters:  08/11/21 170 lb (77.1 kg)     CBC    Component Value Date/Time   WBC 15.3 (H) 08/12/2021 0430   RBC 3.44 (L) 08/12/2021 0430   HGB 11.5 (L) 08/12/2021 0430   HCT 34.9 (L) 08/12/2021 0430   PLT 246 08/12/2021 0430  MCV 101.5 (H) 08/12/2021 0430   MCH 33.4 08/12/2021 0430   MCHC 33.0 08/12/2021 0430   RDW 13.3 08/12/2021 0430   LYMPHSABS 0.2 (L) 08/12/2021 0430   MONOABS 0.4 08/12/2021 0430   EOSABS 0.0 08/12/2021 0430   BASOSABS 0.0 08/12/2021 0430     Chest Imaging:  08/28/2021 CT chest: Lower lobe areas of tree-in-bud nodularity.  Smaller scattered stable pulmonary nodules from previous lung cancer screening CT in 2021. The patient's images have been independently reviewed by me.    Pulmonary Functions Testing Results: PFT Results Latest Ref Rng & Units 11/19/2017  FVC-Pre L 2.16  FVC-Predicted Pre % 57  FVC-Post L 3.09  FVC-Predicted Post % 81  Pre FEV1/FVC % % 43  Post FEV1/FCV % % 40  FEV1-Pre L 0.93  FEV1-Predicted Pre % 34  FEV1-Post L 1.23  DLCO uncorrected ml/min/mmHg 15.25  DLCO UNC% % 53  DLVA Predicted % 82  TLC L 7.55  TLC % Predicted % 117  RV % Predicted % 221    FeNO:   Pathology:   Echocardiogram:   Heart Catheterization:     Assessment & Plan:     ICD-10-CM   1. Multiple pulmonary nodules  R91.8 CT Super D Chest Wo Contrast    2. Former smoker  Z87.891 CT Super D Chest Wo Contrast    3. Centrilobular emphysema ([place])  J43.2       Discussion:  This is a 76 year old gentleman, 50+ pack year history of smoking, quit 2 years ago, multiple pulmonary nodules found on lung cancer screening CT in 2021, follow-up CT imaging in January 2023 with other areas of  tree-in-bud opacities, no other significant new nodules or may be 1 that is a little bigger in the right lower lobe  Plan: Recommend repeat noncontrasted CT scan of the chest in 1 year Repeat CT in January 2024. He has centrilobular edema on his CT imaging, would continue Trelegy likely has underlying COPD. Continue albuterol for shortness of breath and wheezing.  We appreciate consultation. Orders have been placed for repeat CT Follow-up with [place] in 1 year.   Current Outpatient Medications:    acetaminophen (TYLENOL) 500 MG tablet, Take 1,000 mg by mouth every 6 (six) hours as needed for headache (pain)., Disp: , Rfl:    albuterol (VENTOLIN HFA) 108 (90 Base) MCG/ACT inhaler, Inhale 2 puffs into the lungs every 6 (six) hours as needed for wheezing or shortness of breath., Disp: 1 each, Rfl: 1   azithromycin (ZITHROMAX) 250 MG tablet, Take 1 tablet (250 mg total) by mouth daily., Disp: 3 each, Rfl: 0   betamethasone dipropionate 0.05 % lotion, Apply 1 application topically daily as needed (psoriasis/eczema)., Disp: , Rfl:    cholecalciferol (VITAMIN D) 25 MCG (1000 UNIT) tablet, Take 1,000 Units by mouth every morning., Disp: , Rfl:    FLUoxetine (PROZAC) 40 MG capsule, Take 40 mg by mouth every morning., Disp: , Rfl:    Fluticasone-Umeclidin-Vilant (TRELEGY ELLIPTA) 100-62.5-25 MCG/ACT AEPB, Inhale 1 puff into the lungs every morning., Disp: 1 each, Rfl: 1   folic acid (FOLVITE) 1 MG tablet, Take 1 mg by mouth every morning., Disp: , Rfl:    hydrocortisone 2.5 % cream, Apply 1 application topically 2 (two) times daily as needed (psoriasis/eczema)., Disp: , Rfl:    levothyroxine (SYNTHROID) 150 MCG tablet, Take 150 mcg by mouth daily before breakfast., Disp: , Rfl:    methotrexate (RHEUMATREX) 2.5 MG tablet, Take 6 tablets (15 mg total) by  mouth every Sunday., Disp: 4 tablet, Rfl: 0   montelukast (SINGULAIR) 10 MG tablet, Take 10 mg by mouth every morning., Disp: , Rfl:    predniSONE  (DELTASONE) 50 MG tablet, Take 1 tablet (50 mg total) by mouth daily with breakfast., Disp: 3 tablet, Rfl: 0   simvastatin (ZOCOR) 40 MG tablet, Take 40 mg by mouth every morning., Disp: , Rfl:    vitamin B-12 (CYANOCOBALAMIN) 1000 MCG tablet, Take 1,000 mcg by mouth every morning., Disp: , Rfl:    Garner Nash, DO [facility] 09/10/2021 9:06 AM

## 2021-09-10 NOTE — Patient Instructions (Signed)
Thank you for visiting Dr. [name] at [facility]. Today we recommend the following:  Orders Placed This Encounter  Procedures   CT Super D Chest Wo Contrast   Please follow up with [place] next year  Repeat CT chest in 12 months   Return in about 1 year (around 09/10/2022) for with APP or Dr. [name].    Please do your part to reduce the spread of COVID-19.

## 2021-10-01 ENCOUNTER — Other Ambulatory Visit: Payer: Self-pay

## 2021-10-01 ENCOUNTER — Ambulatory Visit (INDEPENDENT_AMBULATORY_CARE_PROVIDER_SITE_OTHER): Payer: Medicare Other | Admitting: Primary Care

## 2021-10-01 ENCOUNTER — Encounter: Payer: Self-pay | Admitting: Primary Care

## 2021-10-01 ENCOUNTER — Ambulatory Visit (INDEPENDENT_AMBULATORY_CARE_PROVIDER_SITE_OTHER): Payer: Medicare Other

## 2021-10-01 VITALS — BP 114/68 | HR 98 | Temp 98.1°F | Ht 66.0 in | Wt 167.6 lb

## 2021-10-01 DIAGNOSIS — J309 Allergic rhinitis, unspecified: Secondary | ICD-10-CM | POA: Insufficient documentation

## 2021-10-01 DIAGNOSIS — J441 Chronic obstructive pulmonary disease with (acute) exacerbation: Secondary | ICD-10-CM | POA: Diagnosis not present

## 2021-10-01 DIAGNOSIS — J189 Pneumonia, unspecified organism: Secondary | ICD-10-CM | POA: Diagnosis not present

## 2021-10-01 DIAGNOSIS — J310 Chronic rhinitis: Secondary | ICD-10-CM

## 2021-10-01 MED ORDER — PREDNISONE 10 MG PO TABS: ORAL_TABLET | ORAL | 0 refills | Status: DC

## 2021-10-01 MED ORDER — BENZONATATE 200 MG PO CAPS: 200.0000 mg | ORAL_CAPSULE | Freq: Three times a day (TID) | ORAL | 1 refills | Status: DC | PRN

## 2021-10-01 MED ORDER — FLUTICASONE PROPIONATE 50 MCG/ACT NA SUSP: 1.0000 | Freq: Every day | NASAL | 2 refills | Status: DC

## 2021-10-01 MED ORDER — FLUTICASONE-UMECLIDIN-VILANT 200-62.5-25 MCG/ACT IN AEPB: 1.0000 | INHALATION_SPRAY | Freq: Every day | RESPIRATORY_TRACT | 0 refills | Status: DC

## 2021-10-01 NOTE — Progress Notes (Signed)
Please let patient know CXR showed stable mild chronic findings. No pneumonia.

## 2021-10-01 NOTE — Assessment & Plan Note (Addendum)
-  Admitted in December 2022 for CAP and has had a persistent dry cough since. Checking CXR today

## 2021-10-01 NOTE — Assessment & Plan Note (Signed)
-   Start flonase nasal spray 1 puff per nostril daily ?

## 2021-10-01 NOTE — Assessment & Plan Note (Addendum)
-   Cough likely post viral, however, could be a component of AECOPD. Lungs were clear on exam. Plan increase Trelegy x2 weeks and sending in prednisone taper. Start delsym cough syrup q 12 hours and prn tesslon perles for cough suppression. FU in 2-4 weeks or sooner if needed. If no improvement consider getting sputum sample if able.

## 2021-10-01 NOTE — Patient Instructions (Addendum)
Recommendations: - Take Trelegy one puff daily in the morning x 2 weeks then resume Trelegy 100 as prescribed  - Prednisone taper as directed - Start Flonase nasal spray 1 puff per nostril daily  - Take Delsym cough syrup twice daily as needed for cough suppression (over the counter) - Take tessalon perles three times a day as needed for cough suppression  - Stop mucinex   Orders: - CXR today  Follow-up: - 2-4 week follow-up with Waynetta Sandy NP

## 2021-10-01 NOTE — Progress Notes (Signed)
@Patient  ID: Harry Lucas, male    DOB: Mar 09, 1946, 76 y.o.   MRN: [id number]  Chief Complaint  Patient presents with   Follow-up    Cough     Referring provider: [id number], MD  HPI: 76 year old male, former smoker. PMH significant for COPD, CAP, OSA on CPAP, aortic atherosclerosis, coronary artery calcification.  Previous [facility] pulmonary encounter: 09/10/21- Dr. 09/12/21, consult  This is a 76 year old gentleman, past medical history of coronary disease.  Patient is a former smoker who quit in 2020.Patient had a CT scan of the chest on 08/28/2021, patient was found to have diffuse basilar predominant tree-in-bud nodularity new since previous CT imaging favoring an infectious bronchiolitis.  Has a few smaller bilateral pulmonary nodules.  Initial lung cancer screening CT was in January 2021.  Patient had a lung cancer screening CT then which was read as a lung RADS 2.  Was found to have small scattered pulmonary nodules largest of which was 5.7 mm.  Discussion: This is a 76 year old gentleman, 50+ pack year history of smoking, quit 2 years ago, multiple pulmonary nodules found on lung cancer screening CT in 2021, follow-up CT imaging in January 2023 with other areas of tree-in-bud opacities, no other significant new nodules or may be 1 that is a little bigger in the right lower lobe   Plan: Recommend repeat noncontrasted CT scan of the chest in 1 year Repeat CT in January 2024. He has centrilobular edema on his CT imaging, would continue Trelegy likely has underlying COPD. Continue albuterol for shortness of breath and wheezing.  We appreciate consultation. Orders have been placed for repeat CT Follow-up with February 2024 in 1 year.  10/01/2021 - Interim hx  Patient presents today for acute OV due to cough. He has had a cough since December 2022 when he was admitted for CAP. Cough is mainly dry with rare clear mucus production. He has some associated wheezing and post nasal drip symptoms. He  has been taking mucinex for cough. He is compliant with Trelegy January 2023 daily. He uses albuterol 3-4 times a week. He finished round of zpack and prednisone in January. He was able to play a round of gold recently without significant shortness of breath.  Pulmonary function testing 11/19/2017 >> FVC 3.09 (81%), FEV1 1.23 (45%), ratio 40  Severe obstructive airways disease with significant bronchodilator response   No Known Allergies  Immunization History  Administered Date(s) Administered   PFIZER(Purple Top)SARS-COV-2 Vaccination 10/08/2019, 11/02/2019    Past Medical History:  Diagnosis Date   Coronary artery disease    Eczema     Tobacco History: Social History   Tobacco Use  Smoking Status Former   Types: Cigarettes   Quit date: 07/13/2019   Years since quitting: 2.2   Passive exposure: Past  Smokeless Tobacco Never   Counseling given: Not Answered   Outpatient Medications Prior to Visit  Medication Sig Dispense Refill   acetaminophen (TYLENOL) 500 MG tablet Take 1,000 mg by mouth every 6 (six) hours as needed for headache (pain).     albuterol (VENTOLIN HFA) 108 (90 Base) MCG/ACT inhaler Inhale 2 puffs into the lungs every 6 (six) hours as needed for wheezing or shortness of breath. 1 each 1   betamethasone dipropionate 0.05 % lotion Apply 1 application topically daily as needed (psoriasis/eczema).     cholecalciferol (VITAMIN D) 25 MCG (1000 UNIT) tablet Take 1,000 Units by mouth every morning.     FLUoxetine (PROZAC) 40 MG capsule Take 40 mg  by mouth every morning.     Fluticasone-Umeclidin-Vilant (TRELEGY ELLIPTA) 100-62.5-25 MCG/ACT AEPB Inhale 1 puff into the lungs every morning. 1 each 1   folic acid (FOLVITE) 1 MG tablet Take 1 mg by mouth every morning.     hydrocortisone 2.5 % cream Apply 1 application topically 2 (two) times daily as needed (psoriasis/eczema).     levothyroxine (SYNTHROID) 150 MCG tablet Take 150 mcg by mouth daily before breakfast.      methotrexate (RHEUMATREX) 2.5 MG tablet Take 6 tablets (15 mg total) by mouth every Sunday. 4 tablet 0   montelukast (SINGULAIR) 10 MG tablet Take 10 mg by mouth every morning.     simvastatin (ZOCOR) 40 MG tablet Take 40 mg by mouth every morning.     vitamin B-12 (CYANOCOBALAMIN) 1000 MCG tablet Take 1,000 mcg by mouth every morning.     azithromycin (ZITHROMAX) 250 MG tablet Take 1 tablet (250 mg total) by mouth daily. (Patient not taking: Reported on 10/01/2021) 3 each 0   predniSONE (DELTASONE) 50 MG tablet Take 1 tablet (50 mg total) by mouth daily with breakfast. (Patient not taking: Reported on 10/01/2021) 3 tablet 0   No facility-administered medications prior to visit.   Review of Systems  Review of Systems  Constitutional: Negative.   HENT:  Positive for postnasal drip.   Respiratory:  Positive for cough and wheezing. Negative for chest tightness and shortness of breath.     Physical Exam  BP 114/68 (BP Location: Right Arm, Cuff Size: Normal)    Pulse 98    Temp 98.1 F (36.7 C) (Oral)    Ht 5\' 6"  (1.676 m)    Wt 167 lb 9.6 oz (76 kg)    SpO2 95%    BMI 27.05 kg/m  Physical Exam Constitutional:      Appearance: Normal appearance.  HENT:     Head: Normocephalic and atraumatic.     Mouth/Throat:     Mouth: Mucous membranes are moist.     Pharynx: Oropharynx is clear.  Cardiovascular:     Rate and Rhythm: Normal rate and regular rhythm.  Pulmonary:     Effort: Pulmonary effort is normal.     Breath sounds: Normal breath sounds. No wheezing, rhonchi or rales.  Musculoskeletal:        General: Normal range of motion.  Skin:    General: Skin is warm and dry.  Neurological:     General: No focal deficit present.     Mental Status: He is alert and oriented to person, place, and time. Mental status is at baseline.  Psychiatric:        Mood and Affect: Mood normal.        Behavior: Behavior normal.        Thought Content: Thought content normal.        Judgment: Judgment  normal.     Lab Results:  CBC    Component Value Date/Time   WBC 15.3 (H) 08/12/2021 0430   RBC 3.44 (L) 08/12/2021 0430   HGB 11.5 (L) 08/12/2021 0430   HCT 34.9 (L) 08/12/2021 0430   PLT 246 08/12/2021 0430   MCV 101.5 (H) 08/12/2021 0430   MCH 33.4 08/12/2021 0430   MCHC 33.0 08/12/2021 0430   RDW 13.3 08/12/2021 0430   LYMPHSABS 0.2 (L) 08/12/2021 0430   MONOABS 0.4 08/12/2021 0430   EOSABS 0.0 08/12/2021 0430   BASOSABS 0.0 08/12/2021 0430    BMET    Component Value Date/Time  NA 136 08/12/2021 0430   K 4.3 08/12/2021 0430   CL 104 08/12/2021 0430   CO2 22 08/12/2021 0430   GLUCOSE 155 (H) 08/12/2021 0430   BUN 20 08/12/2021 0430   CREATININE 1.57 (H) 08/12/2021 0430   CALCIUM 8.9 08/12/2021 0430   GFRNONAA 45 (L) 08/12/2021 0430    BNP    Component Value Date/Time   BNP 54.3 08/11/2021 1745    ProBNP No results found for: PROBNP  Imaging: DG Chest 2 View  Result Date: 10/01/2021 CLINICAL DATA:  Follow-up chest abdomen and pelvis. Postviral cough. EXAM: CHEST - 2 VIEW COMPARISON:  08/11/2021 and CT of the chest on 08/28/2021 FINDINGS: Heart size is normal. There is perihilar peribronchial thickening. Mildly prominent interstitial markings are identified at the lung bases and appear similar to prior studies. No focal consolidations. No pleural effusions or pulmonary edema. IMPRESSION: Mild bronchitic changes and mildly prominent interstitial changes are stable. Electronically Signed   By: Norva Pavlov M.D.   On: 10/01/2021 11:44     Assessment & Plan:   CAP (community acquired pneumonia) -Admitted in December 2022 for CAP and has had a persistent dry cough since. Checking CXR today   COPD with acute exacerbation (HCC) - Cough likely post viral, however, could be a component of AECOPD. Lungs were clear on exam. Plan increase Trelegy x2 weeks and sending in prednisone taper. Start delsym cough syrup q 12 hours and prn tesslon perles for cough  suppression. FU in 2-4 weeks or sooner if needed. If no improvement consider getting sputum sample if able.   Rhinitis - Start flonase nasal spray 1 puff per nostril daily    Glenford Bayley, NP 10/01/2021

## 2021-10-02 NOTE — Progress Notes (Signed)
Spoke with the pt and notified of results and he verbalized understanding

## 2021-10-05 ENCOUNTER — Ambulatory Visit: Payer: Medicare Other | Admitting: Pulmonary Disease

## 2021-10-11 ENCOUNTER — Ambulatory Visit: Payer: Medicare HMO | Admitting: Nurse Practitioner

## 2021-10-11 ENCOUNTER — Encounter: Payer: Self-pay | Admitting: Nurse Practitioner

## 2021-10-11 ENCOUNTER — Other Ambulatory Visit: Payer: Self-pay

## 2021-10-11 VITALS — BP 128/70 | HR 98 | Wt 163.8 lb

## 2021-10-11 DIAGNOSIS — J189 Pneumonia, unspecified organism: Secondary | ICD-10-CM

## 2021-10-11 DIAGNOSIS — B370 Candidal stomatitis: Secondary | ICD-10-CM

## 2021-10-11 DIAGNOSIS — J209 Acute bronchitis, unspecified: Secondary | ICD-10-CM | POA: Diagnosis not present

## 2021-10-11 DIAGNOSIS — R911 Solitary pulmonary nodule: Secondary | ICD-10-CM

## 2021-10-11 DIAGNOSIS — R000 Tachycardia, unspecified: Secondary | ICD-10-CM | POA: Diagnosis not present

## 2021-10-11 DIAGNOSIS — J310 Chronic rhinitis: Secondary | ICD-10-CM

## 2021-10-11 DIAGNOSIS — R058 Other specified cough: Secondary | ICD-10-CM | POA: Diagnosis not present

## 2021-10-11 DIAGNOSIS — J440 Chronic obstructive pulmonary disease with acute lower respiratory infection: Secondary | ICD-10-CM

## 2021-10-11 MED ORDER — GUAIFENESIN ER 600 MG PO TB12: 600.0000 mg | ORAL_TABLET | Freq: Two times a day (BID) | ORAL | 2 refills | Status: DC

## 2021-10-11 MED ORDER — BREZTRI AEROSPHERE 160-9-4.8 MCG/ACT IN AERO: 2.0000 | INHALATION_SPRAY | Freq: Two times a day (BID) | RESPIRATORY_TRACT | 0 refills | Status: DC

## 2021-10-11 MED ORDER — SPACER/AERO-HOLDING CHAMBERS DEVI: 2 refills | Status: DC

## 2021-10-11 MED ORDER — AZELASTINE HCL 0.1 % NA SOLN: 2.0000 | Freq: Two times a day (BID) | NASAL | 2 refills | Status: AC

## 2021-10-11 MED ORDER — HYDROCOD POLI-CHLORPHE POLI ER 10-8 MG/5ML PO SUER: 5.0000 mL | Freq: Two times a day (BID) | ORAL | 0 refills | Status: DC | PRN

## 2021-10-11 MED ORDER — BENZONATATE 200 MG PO CAPS: 200.0000 mg | ORAL_CAPSULE | Freq: Three times a day (TID) | ORAL | 1 refills | Status: DC | PRN

## 2021-10-11 MED ORDER — PREDNISONE 10 MG PO TABS: ORAL_TABLET | ORAL | 0 refills | Status: DC

## 2021-10-11 MED ORDER — NYSTATIN 100000 UNIT/ML MT SUSP: 5.0000 mL | Freq: Four times a day (QID) | OROMUCOSAL | 0 refills | Status: AC

## 2021-10-11 MED ORDER — LORATADINE 10 MG PO TABS: 10.0000 mg | ORAL_TABLET | Freq: Every day | ORAL | 5 refills | Status: AC

## 2021-10-11 NOTE — Assessment & Plan Note (Signed)
Continues to have some rhinitis and allergy type symptoms.  See above plan ?

## 2021-10-11 NOTE — Progress Notes (Signed)
@Patient  ID: Harry Lucas, male    DOB: May 04, 1946, 76 y.o.   MRN: [id number]  No chief complaint on file.   Referring provider: Reynold Bowen, MD  HPI: 76 year old male, former smoker followed for COPD and lung nodules.  He is a patient of Dr. Juline Lucas and last seen in office on 10/01/2021 by Harry Napoleon NP.  Past medical history significant for OSA on CPAP, aortic atherosclerosis, CAD.  TEST/EVENTS:  11/19/2017 PFTs: FVC 3.09 (81%), FEV1 1.23 (45%), ratio 40.  Severe obstructive airways disease with significant bronchodilator response  09/10/2021: OV with Dr. Jamey Lucas for consult.  Patient had a CT scan of the chest on 08/28/2021 and was found to have diffuse basilar predominant tree-in-bud nodularity new since previous CT imaging, favoring an infectious bronchiolitis.  Has a few smaller bilateral pulmonary nodules.  Initial lung cancer screening CT was in January 2021.  Patient had a lung cancer screening CT with lung RADS 2.  Found to have small scattered pulmonary nodules largest of which was 5.7.  At office visit recommended repeat noncontrast CT scan of the chest in 1 year.  Started on Trelegy for centrilobular emphysema and underlying COPD.  Continued albuterol for shortness of breath.  10/01/2021: Harry Lucas with Harry Napoleon NP.  Reported having a persistent dry cough since December 2022 when he was admitted for CAP.  Reported some associated wheezing and postnasal drip symptoms.  Has been compliant with Trelegy 100 mcg daily.  Using albuterol 3-4 times a week.  Finished round of Z-Pak and prednisone in January.  Able to play golf recently without significant shortness of breath.  Increased Trelegy to 200 mcg x 2 weeks.  Prednisone taper.  Cough suppression with Delsym and Tessalon Perles.  Initiated on Flonase for rhinitis.  10/11/2021: Today -acute visit Patient presents today with wife for worsening cough.  He describes his cough as congested but dry.  He has not been able to get any sputum up.  His  breathing has been relatively stable aside from with coughing spells.  He denies any excessive wheezing or any recent fevers.  He does continue to have some nasal drainage and congestion.  He denies orthopnea, PND, lower extremity swelling.  Previous chest x-ray did not show any acute process but did have some evidence of chronic interstitial thickening consistent with possible bronchitis.  He increased his Trelegy to 200 for 2 weeks and did not notice any significant response.  Felt like his cough got worse.  Did notice minimal improvement with prednisone taper.  Has continued to use Delsym feels like it does not do anything.  Continues on Flonase for rhinitis.  Occasionally uses albuterol  Unable to complete FeNO today  No Known Allergies  Immunization History  Administered Date(s) Administered   PFIZER(Purple Top)SARS-COV-2 Vaccination 10/08/2019, 11/02/2019    Past Medical History:  Diagnosis Date   Coronary artery disease    Eczema     Tobacco History: Social History   Tobacco Use  Smoking Status Former   Types: Cigarettes   Quit date: 07/13/2019   Years since quitting: 2.2   Passive exposure: Past  Smokeless Tobacco Never   Counseling given: Not Answered   Outpatient Medications Prior to Visit  Medication Sig Dispense Refill   acetaminophen (TYLENOL) 500 MG tablet Take 1,000 mg by mouth every 6 (six) hours as needed for headache (pain).     albuterol (VENTOLIN HFA) 108 (90 Base) MCG/ACT inhaler Inhale 2 puffs into the lungs every 6 (six) hours as needed for wheezing  or shortness of breath. 1 each 1   azithromycin (ZITHROMAX) 250 MG tablet Take 1 tablet (250 mg total) by mouth daily. 3 each 0   betamethasone dipropionate 0.05 % lotion Apply 1 application topically daily as needed (psoriasis/eczema).     cholecalciferol (VITAMIN D) 25 MCG (1000 UNIT) tablet Take 1,000 Units by mouth every morning.     FLUoxetine (PROZAC) 40 MG capsule Take 40 mg by mouth every morning.      fluticasone (FLONASE) 50 MCG/ACT nasal spray Place 1 spray into both nostrils daily. 16 g 2   Fluticasone-Umeclidin-Vilant (TRELEGY ELLIPTA) 100-62.5-25 MCG/ACT AEPB Inhale 1 puff into the lungs every morning. 1 each 1   Fluticasone-Umeclidin-Vilant 200-62.5-25 MCG/ACT AEPB Inhale 1 puff into the lungs daily. 28 each 0   folic acid (FOLVITE) 1 MG tablet Take 1 mg by mouth every morning.     hydrocortisone 2.5 % cream Apply 1 application topically 2 (two) times daily as needed (psoriasis/eczema).     levothyroxine (SYNTHROID) 150 MCG tablet Take 150 mcg by mouth daily before breakfast.     methotrexate (RHEUMATREX) 2.5 MG tablet Take 6 tablets (15 mg total) by mouth every Sunday. 4 tablet 0   montelukast (SINGULAIR) 10 MG tablet Take 10 mg by mouth every morning.     simvastatin (ZOCOR) 40 MG tablet Take 40 mg by mouth every morning.     vitamin B-12 (CYANOCOBALAMIN) 1000 MCG tablet Take 1,000 mcg by mouth every morning.     benzonatate (TESSALON) 200 MG capsule Take 1 capsule (200 mg total) by mouth 3 (three) times daily as needed for cough. 30 capsule 1   predniSONE (DELTASONE) 10 MG tablet 4 tabs for 2 days, then 3 tabs for 2 days, 2 tabs for 2 days, then 1 tab for 2 days, then stop 20 tablet 0   No facility-administered medications prior to visit.     Review of Systems:   Constitutional: No weight loss or gain, night sweats, fevers, chills, fatigue, or lassitude. HEENT: No headaches, difficulty swallowing, tooth/dental problems, or sore throat. No sneezing, itching, ear ache +nasal congestion, post nasal drip CV:  No chest pain, orthopnea, PND, swelling in lower extremities, anasarca, dizziness, palpitations, syncope Resp: +paroxysmal, congested nonproductive cough. No shortness of breath with exertion or at rest. No excess mucus or change in color of mucus. No hemoptysis. No wheezing.  No chest wall deformity GI:  No heartburn, indigestion, abdominal pain, nausea, vomiting, diarrhea,  change in bowel habits, loss of appetite, bloody stools.  GU: No dysuria, change in color of urine, urgency or frequency.  No flank pain, no hematuria  Skin: No rash, lesions, ulcerations MSK:  No joint pain or swelling.  No decreased range of motion.  No back pain. Neuro: No dizziness or lightheadedness.  Psych: No depression or anxiety. Mood stable.     Physical Exam:  BP 128/70    Pulse 98 Comment: apical   Wt 163 lb 12.8 oz (74.3 kg)    SpO2 98%    BMI 26.44 kg/m   GEN: Pleasant, interactive, well-nourished; in no acute distress. HEENT:  Normocephalic and atraumatic. EACs patent bilaterally. TM pearly gray with present light reflex bilaterally. PERRLA. Sclera white. Nasal turbinates pink, moist and patent bilaterally. Clear rhinorrhea present. Oropharynx erythematous and moist, without exudate or edema. No lesions, ulcerations  NECK:  Supple w/ fair ROM. No JVD present. Normal carotid impulses w/o bruits. Thyroid symmetrical with no goiter or nodules palpated. No lymphadenopathy.   CV: RRR,  no m/r/g, no peripheral edema. Pulses intact, +2 bilaterally. No cyanosis, pallor or clubbing. PULMONARY:  Unlabored, regular breathing. Clear bilaterally A&P w/o wheezes/rales/rhonchi. Bronchitic cough on exam. No accessory muscle use. No dullness to percussion. GI: BS present and normoactive. Soft, non-tender to palpation. No organomegaly or masses detected. No CVA tenderness. MSK: No erythema, warmth or tenderness. Cap refil <2 sec all extrem. No deformities or joint swelling noted.  Neuro: A/Ox3. No focal deficits noted.   Skin: Warm, no lesions or rashe Psych: Normal affect and behavior. Judgement and thought content appropriate.     Lab Results:  CBC    Component Value Date/Time   WBC 15.3 (H) 08/12/2021 0430   RBC 3.44 (L) 08/12/2021 0430   HGB 11.5 (L) 08/12/2021 0430   HCT 34.9 (L) 08/12/2021 0430   PLT 246 08/12/2021 0430   MCV 101.5 (H) 08/12/2021 0430   MCH 33.4 08/12/2021  0430   MCHC 33.0 08/12/2021 0430   RDW 13.3 08/12/2021 0430   LYMPHSABS 0.2 (L) 08/12/2021 0430   MONOABS 0.4 08/12/2021 0430   EOSABS 0.0 08/12/2021 0430   BASOSABS 0.0 08/12/2021 0430    BMET    Component Value Date/Time   NA 136 08/12/2021 0430   K 4.3 08/12/2021 0430   CL 104 08/12/2021 0430   CO2 22 08/12/2021 0430   GLUCOSE 155 (H) 08/12/2021 0430   BUN 20 08/12/2021 0430   CREATININE 1.57 (H) 08/12/2021 0430   CALCIUM 8.9 08/12/2021 0430   GFRNONAA 45 (L) 08/12/2021 0430    BNP    Component Value Date/Time   BNP 54.3 08/11/2021 1745     Imaging:  DG Chest 2 View  Result Date: 10/01/2021 CLINICAL DATA:  Follow-up chest abdomen and pelvis. Postviral cough. EXAM: CHEST - 2 VIEW COMPARISON:  08/11/2021 and CT of the chest on 08/28/2021 FINDINGS: Heart size is normal. There is perihilar peribronchial thickening. Mildly prominent interstitial markings are identified at the lung bases and appear similar to prior studies. No focal consolidations. No pleural effusions or pulmonary edema. IMPRESSION: Mild bronchitic changes and mildly prominent interstitial changes are stable. Electronically Signed   By: Nolon Nations M.D.   On: 10/01/2021 11:44      PFT Results Latest Ref Rng & Units 11/19/2017  FVC-Pre L 2.16  FVC-Predicted Pre % 57  FVC-Post L 3.09  FVC-Predicted Post % 81  Pre FEV1/FVC % % 43  Post FEV1/FCV % % 40  FEV1-Pre L 0.93  FEV1-Predicted Pre % 34  FEV1-Post L 1.23  DLCO uncorrected ml/min/mmHg 15.25  DLCO UNC% % 53  DLVA Predicted % 82  TLC L 7.55  TLC % Predicted % 117  RV % Predicted % 221    No results found for: NITRICOXIDE      Assessment & Plan:   Acute bronchitis with COPD (HCC) Persistent symptoms with unrelenting cough.  No worsening symptoms, productive cough or fevers.  Optimize postnasal drip control and cough suppression.  Breathing is stable without any worsening shortness of breath.  Will change from DPI inhaler to [facility] with  Breztri.  Patient assistance application provided to patient.  Continue albuterol as needed.  Patient Instructions  Stop Trelegy inhaler. Start Breztri 2 puffs Twice daily. Brush tongue and rinse mouth afterwards. With a spacer  -Continue Albuterol inhaler 2 puffs or 3 mL neb every 6 hours as needed for shortness of breath or wheezing. Notify if symptoms persist despite rescue inhaler/neb use. -Continue flonase nasal spray 2 sprays each nostril  -  Continue singulair 10 mg At bedtime  -Continue tessalon Perles (benzonatate) 1 capsule every 8 hours as needed for cough  -Tussionex 5 mL every 12 hours as needed for cough -Astelin nasal spray 2 sprays Twice daily  -Nystatin oral rinse 5 mL four times a day for 10 days. Swish and swallow -Prednisone taper. 4 tabs for 2 days, then 3 tabs for 2 days, 2 tabs for 2 days, then 1 tab for 2 days, then stop. Take in AM with food -Claritin (loratidine) 10 mg daily  -Mucinex 600 mg Twice daily   -Frequent sips of water -Sugar free candies or cough drops throughout the day   Follow up in two weeks with Dr. Valeta Harms or Joellen Jersey Maureena Dabbs,NP. If symptoms do not improve or worsen, please contact office for sooner follow up or seek emergency care.    Rhinitis Continues to have some rhinitis and allergy type symptoms.  See above plan  Oral candida Evidence of thrush on exam.  Changing from Trelegy to [facility] with spacer which will hopefully help.  Nystatin oral rinses for 10 days.  Tachycardia Initially tachycardic when he first arrived to exam room however he was having a coughing fit at the time.  Rechecked during exam with apical heart rate in the 90s.  CAP (community acquired pneumonia) No evidence of recurrence on most recent chest x-ray February 20.  Symptoms have remained relatively the same, with slightly more frequent cough but no production and no fevers.  If no improvement in symptoms with these changes will consider repeat imaging.  Advised to notify if  fevers, worsening fatigue or productive cough develop.  Lung nodule Continue surveillance.  Plans for repeat CT chest in January 2024.   I spent 38 minutes of dedicated to the care of this patient on the date of this encounter to include pre-visit review of records, face-to-face time with the patient discussing conditions above, post visit ordering of testing, clinical documentation with the electronic health record, making appropriate referrals as documented, and communicating necessary findings to members of the patients care team.  Clayton Bibles, NP 10/11/2021  Pt aware and understands NP's role.

## 2021-10-11 NOTE — Assessment & Plan Note (Signed)
Initially tachycardic when he first arrived to exam room however he was having a coughing fit at the time.  Rechecked during exam with apical heart rate in the 90s. ?

## 2021-10-11 NOTE — Assessment & Plan Note (Signed)
No evidence of recurrence on most recent chest x-ray February 20.  Symptoms have remained relatively the same, with slightly more frequent cough but no production and no fevers.  If no improvement in symptoms with these changes will consider repeat imaging.  Advised to notify if fevers, worsening fatigue or productive cough develop. ?

## 2021-10-11 NOTE — Assessment & Plan Note (Signed)
Persistent symptoms with unrelenting cough.  No worsening symptoms, productive cough or fevers.  Optimize postnasal drip control and cough suppression.  Breathing is stable without any worsening shortness of breath.  Will change from DPI inhaler to [facility] with Breztri.  Patient assistance application provided to patient.  Continue albuterol as needed. ? ?Patient Instructions  ?Stop Trelegy inhaler. Start Breztri 2 puffs Twice daily. Brush tongue and rinse mouth afterwards. With a spacer  ?-Continue Albuterol inhaler 2 puffs or 3 mL neb every 6 hours as needed for shortness of breath or wheezing. Notify if symptoms persist despite rescue inhaler/neb use. ?-Continue flonase nasal spray 2 sprays each nostril  ?-Continue singulair 10 mg At bedtime  ?-Continue tessalon Perles (benzonatate) 1 capsule every 8 hours as needed for cough ? ?-Tussionex 5 mL every 12 hours as needed for cough ?-Astelin nasal spray 2 sprays Twice daily  ?-Nystatin oral rinse 5 mL four times a day for 10 days. Swish and swallow ?-Prednisone taper. 4 tabs for 2 days, then 3 tabs for 2 days, 2 tabs for 2 days, then 1 tab for 2 days, then stop. Take in AM with food ?-Claritin (loratidine) 10 mg daily  ?-Mucinex 600 mg Twice daily  ? ?-Frequent sips of water ?-Sugar free candies or cough drops throughout the day  ? ?Follow up in two weeks with Dr. [name] or Florentina Addison Yazmen Briones,NP. If symptoms do not improve or worsen, please contact office for sooner follow up or seek emergency care. ? ? ?

## 2021-10-11 NOTE — Assessment & Plan Note (Signed)
Continue surveillance.  Plans for repeat CT chest in January 2024. ?

## 2021-10-11 NOTE — Assessment & Plan Note (Signed)
Evidence of thrush on exam.  Changing from Trelegy to [facility] with spacer which will hopefully help.  Nystatin oral rinses for 10 days. ?

## 2021-10-11 NOTE — Patient Instructions (Addendum)
Stop Trelegy inhaler. Start Breztri 2 puffs Twice daily. Brush tongue and rinse mouth afterwards. With a spacer  ?-Continue Albuterol inhaler 2 puffs or 3 mL neb every 6 hours as needed for shortness of breath or wheezing. Notify if symptoms persist despite rescue inhaler/neb use. ?-Continue flonase nasal spray 2 sprays each nostril  ?-Continue singulair 10 mg At bedtime  ?-Continue tessalon Perles (benzonatate) 1 capsule every 8 hours as needed for cough ? ?-Tussionex 5 mL every 12 hours as needed for cough ?-Astelin nasal spray 2 sprays Twice daily  ?-Nystatin oral rinse 5 mL four times a day for 10 days. Swish and swallow ?-Prednisone taper. 4 tabs for 2 days, then 3 tabs for 2 days, 2 tabs for 2 days, then 1 tab for 2 days, then stop. Take in AM with food ?-Claritin (loratidine) 10 mg daily  ?-Mucinex 600 mg Twice daily  ? ?-Frequent sips of water ?-Sugar free candies or cough drops throughout the day  ? ?Follow up in two weeks with Dr. [name] or [name],NP. If symptoms do not improve or worsen, please contact office for sooner follow up or seek emergency care. ?

## 2021-10-24 ENCOUNTER — Other Ambulatory Visit: Payer: Self-pay

## 2021-10-24 ENCOUNTER — Ambulatory Visit (INDEPENDENT_AMBULATORY_CARE_PROVIDER_SITE_OTHER): Payer: Medicare HMO | Admitting: Pulmonary Disease

## 2021-10-24 ENCOUNTER — Encounter: Payer: Self-pay | Admitting: Pulmonary Disease

## 2021-10-24 VITALS — BP 104/52 | HR 78 | Temp 97.7°F | Ht 67.0 in | Wt 168.6 lb

## 2021-10-24 DIAGNOSIS — R911 Solitary pulmonary nodule: Secondary | ICD-10-CM

## 2021-10-24 DIAGNOSIS — J432 Centrilobular emphysema: Secondary | ICD-10-CM | POA: Diagnosis not present

## 2021-10-24 DIAGNOSIS — Z87891 Personal history of nicotine dependence: Secondary | ICD-10-CM

## 2021-10-24 DIAGNOSIS — R918 Other nonspecific abnormal finding of lung field: Secondary | ICD-10-CM | POA: Diagnosis not present

## 2021-10-24 DIAGNOSIS — J209 Acute bronchitis, unspecified: Secondary | ICD-10-CM | POA: Diagnosis not present

## 2021-10-24 DIAGNOSIS — J440 Chronic obstructive pulmonary disease with acute lower respiratory infection: Secondary | ICD-10-CM

## 2021-10-24 MED ORDER — BREZTRI AEROSPHERE 160-9-4.8 MCG/ACT IN AERO: 2.0000 | INHALATION_SPRAY | Freq: Two times a day (BID) | RESPIRATORY_TRACT | 0 refills | Status: DC

## 2021-10-24 NOTE — Progress Notes (Signed)
? ?Synopsis: Referred in Jan 2023 for lung nodule by Reynold Bowen, MD ? ?Subjective:  ? ?PATIENT ID: Harry Lucas GENDER: male DOB: 01-Oct-1945, MRN: [id number] ? ?Chief Complaint  ?Patient presents with  ? Follow-up  ?  Follow up. Patient has no complaints.   ? ? ?This is a 76 year old gentleman, past medical history of coronary disease.  Patient is a former smoker who quit in 2020.Patient had a CT scan of the chest on 08/28/2021, patient was found to have diffuse basilar predominant tree-in-bud nodularity new since previous CT imaging favoring an infectious bronchiolitis.  Has a few smaller bilateral pulmonary nodules.  Initial lung cancer screening CT was in January 2021.  Patient had a lung cancer screening CT then which was read as a lung RADS 2.  Was found to have small scattered pulmonary nodules largest of which was 5.7 mm. ? ?OV 10/24/2021: Here today for follow-up after recent acute visit with nurse practitioner in the office.  Respiratory symptoms are better.  He had what appears to be an acute exacerbation of underlying lung disease with shortness of breath upper respiratory symptoms.  Change his inhalers from Trelegy to [facility] which she likes better.  Got financial aid assistance application filled out to hopefully help with cost.  Also using antihistamines, allergy medications and nasal sprays which have all improved his symptoms.  He feels like he is back to normal.  He is going back out to play golf at least once a week. ? ? ?Past Medical History:  ?Diagnosis Date  ? Coronary artery disease   ? Eczema   ?  ? ?Family History  ?Problem Relation Age of Onset  ? Hypertension Other   ?  ? ?No past surgical history on file. ? ?Social History  ? ?Socioeconomic History  ? Marital status: Married  ?  Spouse name: Not on file  ? Number of children: Not on file  ? Years of education: Not on file  ? Highest education level: Not on file  ?Occupational History  ? Not on file  ?Tobacco Use  ? Smoking status:  Former  ?  Types: Cigarettes  ?  Quit date: 07/13/2019  ?  Years since quitting: 2.2  ?  Passive exposure: Past  ? Smokeless tobacco: Never  ?Vaping Use  ? Vaping Use: Never used  ?Substance and Sexual Activity  ? Alcohol use: Not Currently  ? Drug use: Never  ? Sexual activity: Not on file  ?Other Topics Concern  ? Not on file  ?Social History Narrative  ? Not on file  ? ?Social Determinants of Health  ? ?Financial Resource Strain: Not on file  ?Food Insecurity: Not on file  ?Transportation Needs: Not on file  ?Physical Activity: Not on file  ?Stress: Not on file  ?Social Connections: Not on file  ?Intimate Partner Violence: Not on file  ?  ? ?No Known Allergies  ? ?Outpatient Medications Prior to Visit  ?Medication Sig Dispense Refill  ? acetaminophen (TYLENOL) 500 MG tablet Take 1,000 mg by mouth every 6 (six) hours as needed for headache (pain).    ? albuterol (VENTOLIN HFA) 108 (90 Base) MCG/ACT inhaler Inhale 2 puffs into the lungs every 6 (six) hours as needed for wheezing or shortness of breath. 1 each 1  ? azelastine (ASTELIN) 0.1 % nasal spray Place 2 sprays into both nostrils 2 (two) times daily. Use in each nostril as directed 30 mL 2  ? azithromycin (ZITHROMAX) 250 MG tablet Take 1 tablet (  250 mg total) by mouth daily. 3 each 0  ? betamethasone dipropionate 0.05 % lotion Apply 1 application topically daily as needed (psoriasis/eczema).    ? Budeson-Glycopyrrol-Formoterol (BREZTRI AEROSPHERE) 160-9-4.8 MCG/ACT AERO Inhale 2 puffs into the lungs in the morning and at bedtime. 5.9 g 0  ? chlorpheniramine-HYDROcodone (TUSSIONEX PENNKINETIC ER) 10-8 MG/5ML Take 5 mLs by mouth every 12 (twelve) hours as needed for cough. 70 mL 0  ? cholecalciferol (VITAMIN D) 25 MCG (1000 UNIT) tablet Take 1,000 Units by mouth every morning.    ? FLUoxetine (PROZAC) 40 MG capsule Take 40 mg by mouth every morning.    ? fluticasone (FLONASE) 50 MCG/ACT nasal spray Place 1 spray into both nostrils daily. 16 g 2  ?  Fluticasone-Umeclidin-Vilant (TRELEGY ELLIPTA) 100-62.5-25 MCG/ACT AEPB Inhale 1 puff into the lungs every morning. 1 each 1  ? Fluticasone-Umeclidin-Vilant 200-62.5-25 MCG/ACT AEPB Inhale 1 puff into the lungs daily. 28 each 0  ? folic acid (FOLVITE) 1 MG tablet Take 1 mg by mouth every morning.    ? guaiFENesin (MUCINEX) 600 MG 12 hr tablet Take 1 tablet (600 mg total) by mouth 2 (two) times daily. 60 tablet 2  ? hydrocortisone 2.5 % cream Apply 1 application topically 2 (two) times daily as needed (psoriasis/eczema).    ? levothyroxine (SYNTHROID) 150 MCG tablet Take 150 mcg by mouth daily before breakfast.    ? loratadine (CLARITIN) 10 MG tablet Take 1 tablet (10 mg total) by mouth daily. 30 tablet 5  ? methotrexate (RHEUMATREX) 2.5 MG tablet Take 6 tablets (15 mg total) by mouth every Sunday. 4 tablet 0  ? montelukast (SINGULAIR) 10 MG tablet Take 10 mg by mouth every morning.    ? predniSONE (DELTASONE) 10 MG tablet 4 tabs for 2 days, then 3 tabs for 2 days, 2 tabs for 2 days, then 1 tab for 2 days, then stop 20 tablet 0  ? simvastatin (ZOCOR) 40 MG tablet Take 40 mg by mouth every morning.    ? Spacer/Aero-Holding Dorise Bullion Use with inhaler. 1 each 2  ? vitamin B-12 (CYANOCOBALAMIN) 1000 MCG tablet Take 1,000 mcg by mouth every morning.    ? benzonatate (TESSALON) 200 MG capsule Take 1 capsule (200 mg total) by mouth 3 (three) times daily as needed for cough. (Patient not taking: Reported on 10/24/2021) 30 capsule 1  ? ?No facility-administered medications prior to visit.  ? ? ?Review of Systems  ?Constitutional:  Negative for chills, fever, malaise/fatigue and weight loss.  ?HENT:  Negative for hearing loss, sore throat and tinnitus.   ?Eyes:  Negative for blurred vision and double vision.  ?Respiratory:  Positive for shortness of breath. Negative for cough, hemoptysis, sputum production, wheezing and stridor.   ?Cardiovascular:  Negative for chest pain, palpitations, orthopnea, leg swelling and PND.   ?Gastrointestinal:  Negative for abdominal pain, constipation, diarrhea, heartburn, nausea and vomiting.  ?Genitourinary:  Negative for dysuria, hematuria and urgency.  ?Musculoskeletal:  Negative for joint pain and myalgias.  ?Skin:  Negative for itching and rash.  ?Neurological:  Negative for dizziness, tingling, weakness and headaches.  ?Endo/Heme/Allergies:  Negative for environmental allergies. Does not bruise/bleed easily.  ?Psychiatric/Behavioral:  Negative for depression. The patient is not nervous/anxious and does not have insomnia.   ?All other systems reviewed and are negative. ? ? ?Objective:  ?Physical Exam ?Vitals reviewed.  ?Constitutional:   ?   General: He is not in acute distress. ?   Appearance: He is well-developed.  ?HENT:  ?  Head: Normocephalic and atraumatic.  ?Eyes:  ?   General: No scleral icterus. ?   Conjunctiva/sclera: Conjunctivae normal.  ?   Pupils: Pupils are equal, round, and reactive to light.  ?Neck:  ?   Vascular: No JVD.  ?   Trachea: No tracheal deviation.  ?Cardiovascular:  ?   Rate and Rhythm: Normal rate and regular rhythm.  ?   Heart sounds: No murmur heard. ?Pulmonary:  ?   Effort: Pulmonary effort is normal. No tachypnea, accessory muscle usage or respiratory distress.  ?   Breath sounds: Normal breath sounds. No stridor. No wheezing, rhonchi or rales.  ?Abdominal:  ?   General: There is no distension.  ?   Palpations: Abdomen is soft.  ?   Tenderness: There is no abdominal tenderness.  ?Musculoskeletal:     ?   General: No tenderness.  ?   Cervical back: Neck supple.  ?Lymphadenopathy:  ?   Cervical: No cervical adenopathy.  ?Skin: ?   General: Skin is warm and dry.  ?   Capillary Refill: Capillary refill takes less than 2 seconds.  ?   Findings: No rash.  ?Neurological:  ?   Mental Status: He is alert and oriented to person, place, and time.  ?Psychiatric:     ?   Behavior: Behavior normal.  ? ? ? ?Vitals:  ? 10/24/21 1153  ?BP: (!) 104/52  ?Pulse: 78  ?Temp: 97.7 ?F  (36.5 ?C)  ?TempSrc: Oral  ?SpO2: 98%  ?Weight: 168 lb 9.6 oz (76.5 kg)  ?Height: 5\' 7"  (1.702 m)  ? ?98% on RA ?BMI Readings from Last 3 Encounters:  ?10/24/21 26.41 kg/m?  ?10/11/21 26.44 kg/m?  ?10/01/21 27.05 kg/m?  ?

## 2021-10-24 NOTE — Addendum Note (Signed)
Addended by: Arlyss Repress on: 10/24/2021 04:50 PM ? ? Modules accepted: Orders ? ?

## 2021-10-24 NOTE — Patient Instructions (Signed)
Thank you for visiting Dr. Valeta Harms at [facility]. ?Today we recommend the following: ? ?Follow up with [place] in Jan 2024 after ct chest  ?Stay on breztri inhaler ?If there are medication cost issues let us know.  ? ?Return in about 10 months (around 08/26/2022) for with APP or Dr. Valeta Harms. ? ? ? ?Please do your part to reduce the spread of COVID-19.  ? ?

## 2021-10-25 ENCOUNTER — Ambulatory Visit: Payer: Medicare Other | Admitting: Primary Care

## 2021-11-01 ENCOUNTER — Telehealth: Payer: Self-pay

## 2021-11-01 NOTE — Telephone Encounter (Signed)
Received a fax from  [employer] regarding an approval for  BREZTRI  patient assistance from 10/31/2021 to 08/11/22. Approval letter will be sent to scan center.  ?

## 2021-11-24 DIAGNOSIS — E039 Hypothyroidism, unspecified: Secondary | ICD-10-CM | POA: Diagnosis not present

## 2021-11-24 DIAGNOSIS — R69 Illness, unspecified: Secondary | ICD-10-CM | POA: Diagnosis not present

## 2021-11-24 DIAGNOSIS — J439 Emphysema, unspecified: Secondary | ICD-10-CM | POA: Diagnosis not present

## 2021-11-24 DIAGNOSIS — J309 Allergic rhinitis, unspecified: Secondary | ICD-10-CM | POA: Diagnosis not present

## 2021-11-24 DIAGNOSIS — E785 Hyperlipidemia, unspecified: Secondary | ICD-10-CM | POA: Diagnosis not present

## 2021-11-24 DIAGNOSIS — I719 Aortic aneurysm of unspecified site, without rupture: Secondary | ICD-10-CM | POA: Diagnosis not present

## 2021-11-24 DIAGNOSIS — Z008 Encounter for other general examination: Secondary | ICD-10-CM | POA: Diagnosis not present

## 2021-11-24 DIAGNOSIS — D84821 Immunodeficiency due to drugs: Secondary | ICD-10-CM | POA: Diagnosis not present

## 2021-11-24 DIAGNOSIS — I739 Peripheral vascular disease, unspecified: Secondary | ICD-10-CM | POA: Diagnosis not present

## 2021-11-24 DIAGNOSIS — L400 Psoriasis vulgaris: Secondary | ICD-10-CM | POA: Diagnosis not present

## 2021-11-24 DIAGNOSIS — I2510 Atherosclerotic heart disease of native coronary artery without angina pectoris: Secondary | ICD-10-CM | POA: Diagnosis not present

## 2021-11-24 DIAGNOSIS — E663 Overweight: Secondary | ICD-10-CM | POA: Diagnosis not present

## 2021-11-24 DIAGNOSIS — G4733 Obstructive sleep apnea (adult) (pediatric): Secondary | ICD-10-CM | POA: Diagnosis not present

## 2022-01-08 DIAGNOSIS — F331 Major depressive disorder, recurrent, moderate: Secondary | ICD-10-CM | POA: Diagnosis not present

## 2022-01-08 DIAGNOSIS — I10 Essential (primary) hypertension: Secondary | ICD-10-CM | POA: Diagnosis not present

## 2022-01-08 DIAGNOSIS — M810 Age-related osteoporosis without current pathological fracture: Secondary | ICD-10-CM | POA: Diagnosis not present

## 2022-01-08 DIAGNOSIS — N1831 Chronic kidney disease, stage 3a: Secondary | ICD-10-CM | POA: Diagnosis not present

## 2022-01-08 DIAGNOSIS — I700 Atherosclerosis of aorta: Secondary | ICD-10-CM | POA: Diagnosis not present

## 2022-01-08 DIAGNOSIS — R911 Solitary pulmonary nodule: Secondary | ICD-10-CM | POA: Diagnosis not present

## 2022-01-08 DIAGNOSIS — G4733 Obstructive sleep apnea (adult) (pediatric): Secondary | ICD-10-CM | POA: Diagnosis not present

## 2022-01-08 DIAGNOSIS — E039 Hypothyroidism, unspecified: Secondary | ICD-10-CM | POA: Diagnosis not present

## 2022-01-08 DIAGNOSIS — I2510 Atherosclerotic heart disease of native coronary artery without angina pectoris: Secondary | ICD-10-CM | POA: Diagnosis not present

## 2022-01-08 DIAGNOSIS — E785 Hyperlipidemia, unspecified: Secondary | ICD-10-CM | POA: Diagnosis not present

## 2022-01-08 DIAGNOSIS — J449 Chronic obstructive pulmonary disease, unspecified: Secondary | ICD-10-CM | POA: Diagnosis not present

## 2022-01-08 DIAGNOSIS — N401 Enlarged prostate with lower urinary tract symptoms: Secondary | ICD-10-CM | POA: Diagnosis not present

## 2022-01-29 DIAGNOSIS — I700 Atherosclerosis of aorta: Secondary | ICD-10-CM | POA: Diagnosis not present

## 2022-01-29 DIAGNOSIS — I2510 Atherosclerotic heart disease of native coronary artery without angina pectoris: Secondary | ICD-10-CM | POA: Diagnosis not present

## 2022-01-29 DIAGNOSIS — R0609 Other forms of dyspnea: Secondary | ICD-10-CM | POA: Diagnosis not present

## 2022-05-15 DIAGNOSIS — Z79899 Other long term (current) drug therapy: Secondary | ICD-10-CM | POA: Diagnosis not present

## 2022-05-15 DIAGNOSIS — L400 Psoriasis vulgaris: Secondary | ICD-10-CM | POA: Diagnosis not present

## 2022-06-24 DIAGNOSIS — Z125 Encounter for screening for malignant neoplasm of prostate: Secondary | ICD-10-CM | POA: Diagnosis not present

## 2022-06-24 DIAGNOSIS — R7989 Other specified abnormal findings of blood chemistry: Secondary | ICD-10-CM | POA: Diagnosis not present

## 2022-06-24 DIAGNOSIS — M810 Age-related osteoporosis without current pathological fracture: Secondary | ICD-10-CM | POA: Diagnosis not present

## 2022-06-24 DIAGNOSIS — E785 Hyperlipidemia, unspecified: Secondary | ICD-10-CM | POA: Diagnosis not present

## 2022-06-24 DIAGNOSIS — I700 Atherosclerosis of aorta: Secondary | ICD-10-CM | POA: Diagnosis not present

## 2022-06-24 DIAGNOSIS — E039 Hypothyroidism, unspecified: Secondary | ICD-10-CM | POA: Diagnosis not present

## 2022-07-01 DIAGNOSIS — Z23 Encounter for immunization: Secondary | ICD-10-CM | POA: Diagnosis not present

## 2022-07-01 DIAGNOSIS — Z1331 Encounter for screening for depression: Secondary | ICD-10-CM | POA: Diagnosis not present

## 2022-07-01 DIAGNOSIS — R82998 Other abnormal findings in urine: Secondary | ICD-10-CM | POA: Diagnosis not present

## 2022-07-01 DIAGNOSIS — N1831 Chronic kidney disease, stage 3a: Secondary | ICD-10-CM | POA: Diagnosis not present

## 2022-07-01 DIAGNOSIS — R911 Solitary pulmonary nodule: Secondary | ICD-10-CM | POA: Diagnosis not present

## 2022-07-01 DIAGNOSIS — E039 Hypothyroidism, unspecified: Secondary | ICD-10-CM | POA: Diagnosis not present

## 2022-07-01 DIAGNOSIS — R69 Illness, unspecified: Secondary | ICD-10-CM | POA: Diagnosis not present

## 2022-07-01 DIAGNOSIS — Z0000 Encounter for general adult medical examination without abnormal findings: Secondary | ICD-10-CM | POA: Diagnosis not present

## 2022-07-01 DIAGNOSIS — M810 Age-related osteoporosis without current pathological fracture: Secondary | ICD-10-CM | POA: Diagnosis not present

## 2022-07-01 DIAGNOSIS — I2510 Atherosclerotic heart disease of native coronary artery without angina pectoris: Secondary | ICD-10-CM | POA: Diagnosis not present

## 2022-07-01 DIAGNOSIS — I700 Atherosclerosis of aorta: Secondary | ICD-10-CM | POA: Diagnosis not present

## 2022-07-01 DIAGNOSIS — E785 Hyperlipidemia, unspecified: Secondary | ICD-10-CM | POA: Diagnosis not present

## 2022-07-01 DIAGNOSIS — F331 Major depressive disorder, recurrent, moderate: Secondary | ICD-10-CM | POA: Diagnosis not present

## 2022-07-01 DIAGNOSIS — J449 Chronic obstructive pulmonary disease, unspecified: Secondary | ICD-10-CM | POA: Diagnosis not present

## 2022-08-15 DIAGNOSIS — N529 Male erectile dysfunction, unspecified: Secondary | ICD-10-CM | POA: Diagnosis not present

## 2022-08-15 DIAGNOSIS — R69 Illness, unspecified: Secondary | ICD-10-CM | POA: Diagnosis not present

## 2022-08-15 DIAGNOSIS — J449 Chronic obstructive pulmonary disease, unspecified: Secondary | ICD-10-CM | POA: Diagnosis not present

## 2022-08-15 DIAGNOSIS — L309 Dermatitis, unspecified: Secondary | ICD-10-CM | POA: Diagnosis not present

## 2022-08-15 DIAGNOSIS — I129 Hypertensive chronic kidney disease with stage 1 through stage 4 chronic kidney disease, or unspecified chronic kidney disease: Secondary | ICD-10-CM | POA: Diagnosis not present

## 2022-08-15 DIAGNOSIS — N1831 Chronic kidney disease, stage 3a: Secondary | ICD-10-CM | POA: Diagnosis not present

## 2022-08-15 DIAGNOSIS — Z809 Family history of malignant neoplasm, unspecified: Secondary | ICD-10-CM | POA: Diagnosis not present

## 2022-08-15 DIAGNOSIS — L400 Psoriasis vulgaris: Secondary | ICD-10-CM | POA: Diagnosis not present

## 2022-08-15 DIAGNOSIS — E785 Hyperlipidemia, unspecified: Secondary | ICD-10-CM | POA: Diagnosis not present

## 2022-08-15 DIAGNOSIS — Z008 Encounter for other general examination: Secondary | ICD-10-CM | POA: Diagnosis not present

## 2022-08-15 DIAGNOSIS — E538 Deficiency of other specified B group vitamins: Secondary | ICD-10-CM | POA: Diagnosis not present

## 2022-08-15 DIAGNOSIS — E039 Hypothyroidism, unspecified: Secondary | ICD-10-CM | POA: Diagnosis not present

## 2022-08-19 ENCOUNTER — Ambulatory Visit (HOSPITAL_BASED_OUTPATIENT_CLINIC_OR_DEPARTMENT_OTHER)
Admission: RE | Admit: 2022-08-19 | Discharge: 2022-08-19 | Disposition: A | Discharge status: Home / Self Care | Payer: Medicare HMO | Source: Ambulatory Visit | Attending: Pulmonary Disease | Admitting: Pulmonary Disease

## 2022-08-19 DIAGNOSIS — Z122 Encounter for screening for malignant neoplasm of respiratory organs: Secondary | ICD-10-CM | POA: Insufficient documentation

## 2022-08-19 DIAGNOSIS — J432 Centrilobular emphysema: Secondary | ICD-10-CM | POA: Diagnosis not present

## 2022-08-19 DIAGNOSIS — I700 Atherosclerosis of aorta: Secondary | ICD-10-CM | POA: Diagnosis not present

## 2022-08-19 DIAGNOSIS — J439 Emphysema, unspecified: Secondary | ICD-10-CM | POA: Diagnosis not present

## 2022-08-19 DIAGNOSIS — R918 Other nonspecific abnormal finding of lung field: Secondary | ICD-10-CM | POA: Diagnosis not present

## 2022-08-19 DIAGNOSIS — Z87891 Personal history of nicotine dependence: Secondary | ICD-10-CM | POA: Diagnosis not present

## 2022-08-21 NOTE — Progress Notes (Signed)
Langley Gauss,   I ordered this man's LDCT last time I saw him.  Can you have him followed in the clinic under the program from now on?  And send him a letter?  He has an appointment to see me next week in clinic.  Thanks,  BLI  Garner Nash, DO [facility] 08/21/2022 4:52 PM

## 2022-08-28 ENCOUNTER — Ambulatory Visit (INDEPENDENT_AMBULATORY_CARE_PROVIDER_SITE_OTHER): Payer: Medicare HMO | Admitting: Pulmonary Disease

## 2022-08-28 VITALS — BP 92/60 | HR 80 | Ht 67.0 in | Wt 151.6 lb

## 2022-08-28 DIAGNOSIS — J449 Chronic obstructive pulmonary disease, unspecified: Secondary | ICD-10-CM | POA: Diagnosis not present

## 2022-08-28 DIAGNOSIS — F17200 Nicotine dependence, unspecified, uncomplicated: Secondary | ICD-10-CM

## 2022-08-28 DIAGNOSIS — R911 Solitary pulmonary nodule: Secondary | ICD-10-CM

## 2022-08-28 DIAGNOSIS — R69 Illness, unspecified: Secondary | ICD-10-CM | POA: Diagnosis not present

## 2022-08-28 MED ORDER — BREZTRI AEROSPHERE 160-9-4.8 MCG/ACT IN AERO
2.0000 | INHALATION_SPRAY | Freq: Two times a day (BID) | RESPIRATORY_TRACT | 11 refills | Status: DC
Start: 2022-08-28 — End: 2024-02-25

## 2022-08-28 MED ORDER — ALBUTEROL SULFATE HFA 108 (90 BASE) MCG/ACT IN AERS: 1.0000 | INHALATION_SPRAY | Freq: Four times a day (QID) | RESPIRATORY_TRACT | 11 refills | Status: DC | PRN

## 2022-08-28 NOTE — Progress Notes (Signed)
Synopsis: Referred in Jan 2023 for lung nodule by Reynold Bowen, MD  Subjective:   PATIENT ID: Harry Lucas GENDER: male DOB: 08/30/1945, MRN: [id number]  Chief Complaint  Patient presents with   Follow-up    F/up CT    This is a 77 year old gentleman, past medical history of coronary disease.  Patient is a former smoker who quit in 2020.Patient had a CT scan of the chest on 08/28/2021, patient was found to have diffuse basilar predominant tree-in-bud nodularity new since previous CT imaging favoring an infectious bronchiolitis.  Has a few smaller bilateral pulmonary nodules.  Initial lung cancer screening CT was in January 2021.  Patient had a lung cancer screening CT then which was read as a lung RADS 2.  Was found to have small scattered pulmonary nodules largest of which was 5.7 mm.  OV 10/24/2021: Here today for follow-up after recent acute visit with nurse practitioner in the office.  Respiratory symptoms are better.  He had what appears to be an acute exacerbation of underlying lung disease with shortness of breath upper respiratory symptoms.  Change his inhalers from Trelegy to [facility] which she likes better.  Got financial aid assistance application filled out to hopefully help with cost.  Also using antihistamines, allergy medications and nasal sprays which have all improved his symptoms.  He feels like he is back to normal.  He is going back out to play golf at least once a week.  OV 08/28/2022: Here today for follow-up.Patient had recent lung cancer screening CT completed in January 2024.  Felt to be lung RADS 2, small clustered areas of nodules within the right middle lobe and lingula all less than 4 mm in size felt to be inflammatory.  He has no respiratory complaints today.  Has been doing really well with the [facility].  Wife recently diagnosed with vascular dementia.  Unfortunately he started smoking again he is smoking about 3 to 5 cigarettes/day    Past Medical History:   Diagnosis Date   Coronary artery disease    Eczema      Family History  Problem Relation Age of Onset   Hypertension Other      No past surgical history on file.  Social History   Socioeconomic History   Marital status: Married    Spouse name: Not on file   Number of children: Not on file   Years of education: Not on file   Highest education level: Not on file  Occupational History   Not on file  Tobacco Use   Smoking status: Former    Types: Cigarettes    Quit date: 07/13/2019    Years since quitting: 3.1    Passive exposure: Past   Smokeless tobacco: Never  Vaping Use   Vaping Use: Never used  Substance and Sexual Activity   Alcohol use: Not Currently   Drug use: Never   Sexual activity: Not on file  Other Topics Concern   Not on file  Social History Narrative   Not on file   Social Determinants of Health   Financial Resource Strain: Not on file  Food Insecurity: Not on file  Transportation Needs: Not on file  Physical Activity: Not on file  Stress: Not on file  Social Connections: Not on file  Intimate Partner Violence: Not on file     No Known Allergies   Outpatient Medications Prior to Visit  Medication Sig Dispense Refill   fluticasone (FLONASE) 50 MCG/ACT nasal spray Place 1 spray  into both nostrils daily. 16 g 2   guaiFENesin (MUCINEX) 600 MG 12 hr tablet Take 1 tablet (600 mg total) by mouth 2 (two) times daily. 60 tablet 2   loratadine (CLARITIN) 10 MG tablet Take 1 tablet (10 mg total) by mouth daily. 30 tablet 5   Budeson-Glycopyrrol-Formoterol (BREZTRI AEROSPHERE) 160-9-4.8 MCG/ACT AERO Inhale 2 puffs into the lungs in the morning and at bedtime. 5.9 g 0   Budeson-Glycopyrrol-Formoterol (BREZTRI AEROSPHERE) 160-9-4.8 MCG/ACT AERO Inhale 2 puffs into the lungs in the morning and at bedtime. 10.7 g 0   acetaminophen (TYLENOL) 500 MG tablet Take 1,000 mg by mouth every 6 (six) hours as needed for headache (pain).     azelastine (ASTELIN) 0.1 %  nasal spray Place 2 sprays into both nostrils 2 (two) times daily. Use in each nostril as directed (Patient not taking: Reported on 08/28/2022) 30 mL 2   azithromycin (ZITHROMAX) 250 MG tablet Take 1 tablet (250 mg total) by mouth daily. (Patient not taking: Reported on 08/28/2022) 3 each 0   benzonatate (TESSALON) 200 MG capsule Take 1 capsule (200 mg total) by mouth 3 (three) times daily as needed for cough. (Patient not taking: Reported on 10/24/2021) 30 capsule 1   betamethasone dipropionate 0.05 % lotion Apply 1 application topically daily as needed (psoriasis/eczema).     chlorpheniramine-HYDROcodone (TUSSIONEX PENNKINETIC ER) 10-8 MG/5ML Take 5 mLs by mouth every 12 (twelve) hours as needed for cough. 70 mL 0   cholecalciferol (VITAMIN D) 25 MCG (1000 UNIT) tablet Take 1,000 Units by mouth every morning.     FLUoxetine (PROZAC) 40 MG capsule Take 40 mg by mouth every morning.     Fluticasone-Umeclidin-Vilant (TRELEGY ELLIPTA) 100-62.5-25 MCG/ACT AEPB Inhale 1 puff into the lungs every morning. (Patient not taking: Reported on 08/28/2022) 1 each 1   Fluticasone-Umeclidin-Vilant 200-62.5-25 MCG/ACT AEPB Inhale 1 puff into the lungs daily. (Patient not taking: Reported on 01/30/3085) 28 each 0   folic acid (FOLVITE) 1 MG tablet Take 1 mg by mouth every morning.     hydrocortisone 2.5 % cream Apply 1 application topically 2 (two) times daily as needed (psoriasis/eczema).     levothyroxine (SYNTHROID) 150 MCG tablet Take 150 mcg by mouth daily before breakfast.     methotrexate (RHEUMATREX) 2.5 MG tablet Take 6 tablets (15 mg total) by mouth every Sunday. 4 tablet 0   montelukast (SINGULAIR) 10 MG tablet Take 10 mg by mouth every morning.     predniSONE (DELTASONE) 10 MG tablet 4 tabs for 2 days, then 3 tabs for 2 days, 2 tabs for 2 days, then 1 tab for 2 days, then stop (Patient not taking: Reported on 08/28/2022) 20 tablet 0   simvastatin (ZOCOR) 40 MG tablet Take 40 mg by mouth every morning.      Spacer/Aero-Holding Dorise Bullion Use with inhaler. (Patient not taking: Reported on 08/28/2022) 1 each 2   vitamin B-12 (CYANOCOBALAMIN) 1000 MCG tablet Take 1,000 mcg by mouth every morning.     albuterol (VENTOLIN HFA) 108 (90 Base) MCG/ACT inhaler Inhale 2 puffs into the lungs every 6 (six) hours as needed for wheezing or shortness of breath. (Patient not taking: Reported on 08/28/2022) 1 each 1   No facility-administered medications prior to visit.    Review of Systems  Constitutional:  Negative for chills, fever, malaise/fatigue and weight loss.  HENT:  Negative for hearing loss, sore throat and tinnitus.   Eyes:  Negative for blurred vision and double vision.  Respiratory:  Negative for cough, hemoptysis, sputum production, shortness of breath, wheezing and stridor.   Cardiovascular:  Negative for chest pain, palpitations, orthopnea, leg swelling and PND.  Gastrointestinal:  Negative for abdominal pain, constipation, diarrhea, heartburn, nausea and vomiting.  Genitourinary:  Negative for dysuria, hematuria and urgency.  Musculoskeletal:  Negative for joint pain and myalgias.  Skin:  Negative for itching and rash.  Neurological:  Negative for dizziness, tingling, weakness and headaches.  Endo/Heme/Allergies:  Negative for environmental allergies. Does not bruise/bleed easily.  Psychiatric/Behavioral:  Negative for depression. The patient is not nervous/anxious and does not have insomnia.   All other systems reviewed and are negative.    Objective:  Physical Exam Vitals reviewed.  Constitutional:      General: He is not in acute distress.    Appearance: He is well-developed.  HENT:     Head: Normocephalic and atraumatic.  Eyes:     General: No scleral icterus.    Conjunctiva/sclera: Conjunctivae normal.     Pupils: Pupils are equal, round, and reactive to light.  Neck:     Vascular: No JVD.     Trachea: No tracheal deviation.  Cardiovascular:     Rate and Rhythm: Normal  rate and regular rhythm.     Heart sounds: Normal heart sounds. No murmur heard. Pulmonary:     Effort: Pulmonary effort is normal. No tachypnea, accessory muscle usage or respiratory distress.     Breath sounds: No stridor. No wheezing, rhonchi or rales.  Abdominal:     General: There is no distension.     Palpations: Abdomen is soft.     Tenderness: There is no abdominal tenderness.  Musculoskeletal:        General: No tenderness.     Cervical back: Neck supple.  Lymphadenopathy:     Cervical: No cervical adenopathy.  Skin:    General: Skin is warm and dry.     Capillary Refill: Capillary refill takes less than 2 seconds.     Findings: No rash.  Neurological:     Mental Status: He is alert and oriented to person, place, and time.  Psychiatric:        Behavior: Behavior normal.      Vitals:   08/28/22 1043  BP: 92/60  Pulse: 80  SpO2: 97%  Weight: 151 lb 9.6 oz (68.8 kg)  Height: 5\' 7"  (1.702 m)    97% on RA BMI Readings from Last 3 Encounters:  08/28/22 23.74 kg/m  08/19/22 26.31 kg/m  10/24/21 26.41 kg/m   Wt Readings from Last 3 Encounters:  08/28/22 151 lb 9.6 oz (68.8 kg)  08/19/22 168 lb (76.2 kg)  10/24/21 168 lb 9.6 oz (76.5 kg)     CBC    Component Value Date/Time   WBC 15.3 (H) 08/12/2021 0430   RBC 3.44 (L) 08/12/2021 0430   HGB 11.5 (L) 08/12/2021 0430   HCT 34.9 (L) 08/12/2021 0430   PLT 246 08/12/2021 0430   MCV 101.5 (H) 08/12/2021 0430   MCH 33.4 08/12/2021 0430   MCHC 33.0 08/12/2021 0430   RDW 13.3 08/12/2021 0430   LYMPHSABS 0.2 (L) 08/12/2021 0430   MONOABS 0.4 08/12/2021 0430   EOSABS 0.0 08/12/2021 0430   BASOSABS 0.0 08/12/2021 0430     Chest Imaging:  08/28/2021 CT chest: Lower lobe areas of tree-in-bud nodularity.  Smaller scattered stable pulmonary nodules from previous lung cancer screening CT in 2021. The patient's images have been independently reviewed by me.    Lung  cancer screening CT January 2024: Few  scattered small areas of nodular infiltrate, less than 4 mm in size appears inflammatory. Repeat noncontrast CT chest for continued lung cancer screening in 1 year The patient's images have been independently reviewed by me.    Pulmonary Functions Testing Results:    Latest Ref Rng & Units 11/19/2017   11:24 AM  PFT Results  FVC-Pre L 2.16   FVC-Predicted Pre % 57   FVC-Post L 3.09   FVC-Predicted Post % 81   Pre FEV1/FVC % % 43   Post FEV1/FCV % % 40   FEV1-Pre L 0.93   FEV1-Predicted Pre % 34   FEV1-Post L 1.23   DLCO uncorrected ml/min/mmHg 15.25   DLCO UNC% % 53   DLVA Predicted % 82   TLC L 7.55   TLC % Predicted % 117   RV % Predicted % 221     FeNO:   Pathology:   Echocardiogram:   Heart Catheterization:     Assessment & Plan:     ICD-10-CM   1. Smoker  F17.200 Ambulatory Referral for Lung Cancer Scre    2. Lung nodule  R91.1     3. Chronic obstructive pulmonary disease, unspecified COPD type ([place])  J44.9        Discussion:  This is a 77 year old gentleman, 50+ pack year history of smoking, previously quit but unfortunately recently restarted smoking 3 to 5 cigarettes a day.  Lung cancer screening CT follow-up completed this year with a few areas of small less than 4 mm scattered nodules.  Felt to be inflammatory.  Plan: Repeat noncontrasted CT chest for lung cancer screening to be complete in 1 year. Will place referral to lung cancer screening program to follow this. Continue Breztri inhaler. He would like to stay on this getting mailed to his house if possible. We will make sure his [place] and me application is up-to-date. Continue albuterol as needed Continue nasal spray, antihistamines and Singulair.  Return to clinic in 1 year to see me or APP.    Current Outpatient Medications:    fluticasone (FLONASE) 50 MCG/ACT nasal spray, Place 1 spray into both nostrils daily., Disp: 16 g, Rfl: 2   guaiFENesin (MUCINEX) 600 MG 12 hr tablet, Take 1 tablet  (600 mg total) by mouth 2 (two) times daily., Disp: 60 tablet, Rfl: 2   loratadine (CLARITIN) 10 MG tablet, Take 1 tablet (10 mg total) by mouth daily., Disp: 30 tablet, Rfl: 5   acetaminophen (TYLENOL) 500 MG tablet, Take 1,000 mg by mouth every 6 (six) hours as needed for headache (pain)., Disp: , Rfl:    albuterol (VENTOLIN HFA) 108 (90 Base) MCG/ACT inhaler, Inhale 1-2 puffs into the lungs every 6 (six) hours as needed for wheezing or shortness of breath., Disp: 1 each, Rfl: 11   azelastine (ASTELIN) 0.1 % nasal spray, Place 2 sprays into both nostrils 2 (two) times daily. Use in each nostril as directed (Patient not taking: Reported on 08/28/2022), Disp: 30 mL, Rfl: 2   azithromycin (ZITHROMAX) 250 MG tablet, Take 1 tablet (250 mg total) by mouth daily. (Patient not taking: Reported on 08/28/2022), Disp: 3 each, Rfl: 0   benzonatate (TESSALON) 200 MG capsule, Take 1 capsule (200 mg total) by mouth 3 (three) times daily as needed for cough. (Patient not taking: Reported on 10/24/2021), Disp: 30 capsule, Rfl: 1   betamethasone dipropionate 0.05 % lotion, Apply 1 application topically daily as needed (psoriasis/eczema)., Disp: , Rfl:  Budeson-Glycopyrrol-Formoterol (BREZTRI AEROSPHERE) 160-9-4.8 MCG/ACT AERO, Inhale 2 puffs into the lungs in the morning and at bedtime., Disp: 10.7 g, Rfl: 11   chlorpheniramine-HYDROcodone (TUSSIONEX PENNKINETIC ER) 10-8 MG/5ML, Take 5 mLs by mouth every 12 (twelve) hours as needed for cough., Disp: 70 mL, Rfl: 0   cholecalciferol (VITAMIN D) 25 MCG (1000 UNIT) tablet, Take 1,000 Units by mouth every morning., Disp: , Rfl:    FLUoxetine (PROZAC) 40 MG capsule, Take 40 mg by mouth every morning., Disp: , Rfl:    Fluticasone-Umeclidin-Vilant (TRELEGY ELLIPTA) 100-62.5-25 MCG/ACT AEPB, Inhale 1 puff into the lungs every morning. (Patient not taking: Reported on 08/28/2022), Disp: 1 each, Rfl: 1   Fluticasone-Umeclidin-Vilant 200-62.5-25 MCG/ACT AEPB, Inhale 1 puff into the  lungs daily. (Patient not taking: Reported on 08/28/2022), Disp: 28 each, Rfl: 0   folic acid (FOLVITE) 1 MG tablet, Take 1 mg by mouth every morning., Disp: , Rfl:    hydrocortisone 2.5 % cream, Apply 1 application topically 2 (two) times daily as needed (psoriasis/eczema)., Disp: , Rfl:    levothyroxine (SYNTHROID) 150 MCG tablet, Take 150 mcg by mouth daily before breakfast., Disp: , Rfl:    methotrexate (RHEUMATREX) 2.5 MG tablet, Take 6 tablets (15 mg total) by mouth every Sunday., Disp: 4 tablet, Rfl: 0   montelukast (SINGULAIR) 10 MG tablet, Take 10 mg by mouth every morning., Disp: , Rfl:    predniSONE (DELTASONE) 10 MG tablet, 4 tabs for 2 days, then 3 tabs for 2 days, 2 tabs for 2 days, then 1 tab for 2 days, then stop (Patient not taking: Reported on 08/28/2022), Disp: 20 tablet, Rfl: 0   simvastatin (ZOCOR) 40 MG tablet, Take 40 mg by mouth every morning., Disp: , Rfl:    Spacer/Aero-Holding Dorise Bullion, Use with inhaler. (Patient not taking: Reported on 08/28/2022), Disp: 1 each, Rfl: 2   vitamin B-12 (CYANOCOBALAMIN) 1000 MCG tablet, Take 1,000 mcg by mouth every morning., Disp: , Rfl:    Garner Nash, DO [facility] 08/28/2022 11:03 AM

## 2022-08-28 NOTE — Patient Instructions (Signed)
Thank you for visiting Dr. Valeta Harms at [facility]. Today we recommend the following:  Orders Placed This Encounter  Procedures   Ambulatory Referral for Lung Cancer Scre   Repeat in 1 year   Meds ordered this encounter  Medications   Budeson-Glycopyrrol-Formoterol (BREZTRI AEROSPHERE) 160-9-4.8 MCG/ACT AERO    Sig: Inhale 2 puffs into the lungs in the morning and at bedtime.    Dispense:  10.7 g    Refill:  11    Order Specific Question:   Lot Number?    Answer:   [id number]    Order Specific Question:   Expiration Date?    Answer:   04/12/2024    Order Specific Question:   Manufacturer?    Answer:   [employer] [[id number]]   albuterol (VENTOLIN HFA) 108 (90 Base) MCG/ACT inhaler    Sig: Inhale 1-2 puffs into the lungs every 6 (six) hours as needed for wheezing or shortness of breath.    Dispense:  1 each    Refill:  11   We can make sure the [place] application is up to date.   Return in about 1 year (around 08/29/2023) for with Eric Form, NP, or Dr. Valeta Harms.    Please do your part to reduce the spread of COVID-19.

## 2022-09-30 DIAGNOSIS — Z1152 Encounter for screening for COVID-19: Secondary | ICD-10-CM | POA: Diagnosis not present

## 2022-09-30 DIAGNOSIS — R5383 Other fatigue: Secondary | ICD-10-CM | POA: Diagnosis not present

## 2022-09-30 DIAGNOSIS — R051 Acute cough: Secondary | ICD-10-CM | POA: Diagnosis not present

## 2022-09-30 DIAGNOSIS — K529 Noninfective gastroenteritis and colitis, unspecified: Secondary | ICD-10-CM | POA: Diagnosis not present

## 2022-09-30 DIAGNOSIS — J449 Chronic obstructive pulmonary disease, unspecified: Secondary | ICD-10-CM | POA: Diagnosis not present

## 2022-09-30 DIAGNOSIS — G4733 Obstructive sleep apnea (adult) (pediatric): Secondary | ICD-10-CM | POA: Diagnosis not present

## 2022-09-30 DIAGNOSIS — R0981 Nasal congestion: Secondary | ICD-10-CM | POA: Diagnosis not present

## 2022-10-23 ENCOUNTER — Telehealth: Payer: Self-pay

## 2022-10-23 NOTE — Telephone Encounter (Signed)
Called and spoke with pt to move his appt as requested by Dr Valeta Harms and pt was rude and hung up on me. Pt's appt resch but pt was very rude about it.

## 2022-10-24 ENCOUNTER — Ambulatory Visit (INDEPENDENT_AMBULATORY_CARE_PROVIDER_SITE_OTHER): Payer: Medicare HMO

## 2022-10-24 ENCOUNTER — Ambulatory Visit (INDEPENDENT_AMBULATORY_CARE_PROVIDER_SITE_OTHER): Payer: Medicare HMO | Admitting: Adult Health

## 2022-10-24 ENCOUNTER — Encounter: Payer: Self-pay | Admitting: Adult Health

## 2022-10-24 VITALS — BP 100/50 | HR 62 | Temp 98.3°F | Ht 67.0 in | Wt 141.2 lb

## 2022-10-24 DIAGNOSIS — R059 Cough, unspecified: Secondary | ICD-10-CM | POA: Diagnosis not present

## 2022-10-24 DIAGNOSIS — R051 Acute cough: Secondary | ICD-10-CM

## 2022-10-24 DIAGNOSIS — B370 Candidal stomatitis: Secondary | ICD-10-CM

## 2022-10-24 DIAGNOSIS — J209 Acute bronchitis, unspecified: Secondary | ICD-10-CM | POA: Diagnosis not present

## 2022-10-24 DIAGNOSIS — R911 Solitary pulmonary nodule: Secondary | ICD-10-CM | POA: Diagnosis not present

## 2022-10-24 DIAGNOSIS — J449 Chronic obstructive pulmonary disease, unspecified: Secondary | ICD-10-CM

## 2022-10-24 DIAGNOSIS — J439 Emphysema, unspecified: Secondary | ICD-10-CM | POA: Diagnosis not present

## 2022-10-24 DIAGNOSIS — J440 Chronic obstructive pulmonary disease with acute lower respiratory infection: Secondary | ICD-10-CM | POA: Diagnosis not present

## 2022-10-24 MED ORDER — CLOTRIMAZOLE 10 MG MT TROC: 10.0000 mg | Freq: Every day | OROMUCOSAL | 0 refills | Status: DC

## 2022-10-24 MED ORDER — ALBUTEROL SULFATE (2.5 MG/3ML) 0.083% IN NEBU
2.5000 mg | INHALATION_SOLUTION | Freq: Once | RESPIRATORY_TRACT | Status: AC
  Administered 2022-10-24: 2.5 mg via RESPIRATORY_TRACT

## 2022-10-24 MED ORDER — SPACER/AERO-HOLDING CHAMBERS DEVI: 2.0000 | Freq: Two times a day (BID) | 0 refills | Status: AC

## 2022-10-24 MED ORDER — AZITHROMYCIN 250 MG PO TABS: ORAL_TABLET | ORAL | 0 refills | Status: AC

## 2022-10-24 MED ORDER — PREDNISONE 10 MG PO TABS: ORAL_TABLET | ORAL | 0 refills | Status: DC

## 2022-10-24 NOTE — Assessment & Plan Note (Signed)
Slow to resolve exacerbation.  Check chest x-ray today. Will treat with empiric antibiotics and steroid.  Albuterol nebulizer given in office. Continue on Breztri twice daily  Plan  Patient Instructions  Chest xray today  Zpack take as directed.  Prednisone taper over next week.  Hold Methotrexate for 1 week.  Delsym 2 tsp Twice daily  As needed  cough.  Continue on Breztri 2 puffs Twice daily  with spacer, rinse after use.  Mycelex five times a day for 1 week.  Albuterol inhaler As needed   High protein diet .  Follow up with PCP for weight loss.  Follow up with Dr. Valeta Harms in 6-8 weeks and As needed   Please contact office for sooner follow up if symptoms do not improve or worsen or seek emergency care     '

## 2022-10-24 NOTE — Assessment & Plan Note (Signed)
Scattered lung nodules noted on CT chest.  Patient to states in the lung cancer screening program.  Continue with surveillance CT as planned.  There was some very small scattered nodularity.  A postinflammatory or infectious.  Patient has been treated with antibiotics.  Will follow on CT surveillance.

## 2022-10-24 NOTE — Assessment & Plan Note (Signed)
Oral candidiasis noted.  Patient is to begin Mycelex atrocious for 7 days.  Care discussed in detail.  Add spacer.

## 2022-10-24 NOTE — Progress Notes (Signed)
$'@Patient'i$  ID: Harry Lucas, male    DOB: 1945/08/29, 77 y.o.   MRN: [id number]  Chief Complaint  Patient presents with   Acute Visit    Referring provider: Reynold Bowen, MD  HPI: 77 year old male former smoker followed for COPD and lung nodules Participates in the lung cancer screening program  TEST/EVENTS :  CT chest August 19, 2022 mild emphysema, small clustered small pulmonary nodules in the right middle lobe and lingula, scattered pulmonary nodules  10/24/2022 Acute OV : Cough  Patient presents for an acute office visit.  Patient complains over the last 4 weeks he has had increased cough, congestion, sore throat, loss of taste and general malaise.  Patient was initially seen by primary care and given doxycycline and a cough syrup.  Patient says that he only had minimal improvement of symptoms continues to have a very bad sore throat and ongoing cough and congestion.  He denies any hemoptysis, chest pain, orthopnea, edema. Has been using some over-the-counter cough medicines.  He remains on Breztri twice daily.   No Known Allergies  Immunization History  Administered Date(s) Administered   Fluad Quad(high Dose 65+) 09/25/2022   Influenza, Quadrivalent, Recombinant, Inj, Pf 05/21/2019, 06/28/2021   Influenza-Unspecified 05/07/2017, 06/26/2018   PFIZER(Purple Top)SARS-COV-2 Vaccination 10/08/2019, 11/02/2019    Past Medical History:  Diagnosis Date   Coronary artery disease    Eczema     Tobacco History: Social History   Tobacco Use  Smoking Status Former   Types: Cigarettes   Quit date: 07/13/2019   Years since quitting: 3.2   Passive exposure: Past  Smokeless Tobacco Never   Counseling given: Not Answered   Outpatient Medications Prior to Visit  Medication Sig Dispense Refill   acetaminophen (TYLENOL) 500 MG tablet Take 1,000 mg by mouth every 6 (six) hours as needed for headache (pain).     albuterol (VENTOLIN HFA) 108 (90 Base) MCG/ACT inhaler Inhale  1-2 puffs into the lungs every 6 (six) hours as needed for wheezing or shortness of breath. 1 each 11   azelastine (ASTELIN) 0.1 % nasal spray Place 2 sprays into both nostrils 2 (two) times daily. Use in each nostril as directed 30 mL 2   betamethasone dipropionate 0.05 % lotion Apply 1 application topically daily as needed (psoriasis/eczema).     Budeson-Glycopyrrol-Formoterol (BREZTRI AEROSPHERE) 160-9-4.8 MCG/ACT AERO Inhale 2 puffs into the lungs in the morning and at bedtime. 10.7 g 11   cholecalciferol (VITAMIN D) 25 MCG (1000 UNIT) tablet Take 1,000 Units by mouth every morning.     FLUoxetine (PROZAC) 40 MG capsule Take 40 mg by mouth every morning.     fluticasone (FLONASE) 50 MCG/ACT nasal spray Place 1 spray into both nostrils daily. 16 g 2   folic acid (FOLVITE) 1 MG tablet Take 1 mg by mouth every morning.     hydrocortisone 2.5 % cream Apply 1 application topically 2 (two) times daily as needed (psoriasis/eczema).     levothyroxine (SYNTHROID) 150 MCG tablet Take 150 mcg by mouth daily before breakfast.     losartan (COZAAR) 25 MG tablet Take 25 mg by mouth daily.     methotrexate (RHEUMATREX) 2.5 MG tablet Take 6 tablets (15 mg total) by mouth every Sunday. 4 tablet 0   simvastatin (ZOCOR) 40 MG tablet Take 40 mg by mouth every morning.     vitamin B-12 (CYANOCOBALAMIN) 1000 MCG tablet Take 1,000 mcg by mouth every morning.     loratadine (CLARITIN) 10 MG tablet Take 1  tablet (10 mg total) by mouth daily. (Patient not taking: Reported on 10/24/2022) 30 tablet 5   montelukast (SINGULAIR) 10 MG tablet Take 10 mg by mouth every morning. (Patient not taking: Reported on 10/24/2022)     Spacer/Aero-Holding Dorise Bullion Use with inhaler. (Patient not taking: Reported on 08/28/2022) 1 each 2   azithromycin (ZITHROMAX) 250 MG tablet Take 1 tablet (250 mg total) by mouth daily. (Patient not taking: Reported on 08/28/2022) 3 each 0   benzonatate (TESSALON) 200 MG capsule Take 1 capsule (200 mg  total) by mouth 3 (three) times daily as needed for cough. (Patient not taking: Reported on 10/24/2021) 30 capsule 1   chlorpheniramine-HYDROcodone (TUSSIONEX PENNKINETIC ER) 10-8 MG/5ML Take 5 mLs by mouth every 12 (twelve) hours as needed for cough. (Patient not taking: Reported on 10/24/2022) 70 mL 0   Fluticasone-Umeclidin-Vilant (TRELEGY ELLIPTA) 100-62.5-25 MCG/ACT AEPB Inhale 1 puff into the lungs every morning. (Patient not taking: Reported on 08/28/2022) 1 each 1   Fluticasone-Umeclidin-Vilant 200-62.5-25 MCG/ACT AEPB Inhale 1 puff into the lungs daily. (Patient not taking: Reported on 08/28/2022) 28 each 0   guaiFENesin (MUCINEX) 600 MG 12 hr tablet Take 1 tablet (600 mg total) by mouth 2 (two) times daily. (Patient not taking: Reported on 10/24/2022) 60 tablet 2   predniSONE (DELTASONE) 10 MG tablet 4 tabs for 2 days, then 3 tabs for 2 days, 2 tabs for 2 days, then 1 tab for 2 days, then stop (Patient not taking: Reported on 08/28/2022) 20 tablet 0   No facility-administered medications prior to visit.     Review of Systems:   Constitutional:   No  weight loss, night sweats,  Fevers, chills,  +fatigue, or  lassitude.  HEENT:   No headaches,  Difficulty swallowing,  Tooth/dental problems, or               No sneezing, itching, ear ache, nasal congestion, post nasal drip,   CV:  No chest pain,  Orthopnea, PND, swelling in lower extremities, anasarca, dizziness, palpitations, syncope.   GI  No heartburn, indigestion, abdominal pain, nausea, vomiting, diarrhea, change in bowel habits, loss of appetite, bloody stools.   Resp:.  No chest wall deformity  Skin: no rash or lesions.  GU: no dysuria, change in color of urine, no urgency or frequency.  No flank pain, no hematuria   MS:  No joint pain or swelling.  No decreased range of motion.  No back pain.    Physical Exam  BP (!) 100/50 (BP Location: Left Arm, Patient Position: Sitting, Cuff Size: Normal)   Pulse 62   Temp 98.3 F  (36.8 C) (Oral)   Ht '5\' 7"'$  (1.702 m)   Wt 141 lb 3.2 oz (64 kg)   SpO2 100%   BMI 22.12 kg/m   GEN: A/Ox3; pleasant , NAD, thin and frail   HEENT:  [place]/AT,  , NOSE-clear, THROAT-posterior pharynx scattered white patches consistent with thrush  NECK:  Supple w/ fair ROM; no JVD; normal carotid impulses w/o bruits; no thyromegaly or nodules palpated; no lymphadenopathy.    RESP  Clear  P & A; w/o, wheezes/ rales/ or rhonchi. no accessory muscle use, no dullness to percussion  CARD:  RRR, no m/r/g, no peripheral edema, pulses intact, no cyanosis or clubbing.  GI:   Soft & nt; nml bowel sounds; no organomegaly or masses detected.   Musco: Warm bil, no deformities or joint swelling noted.   Neuro: alert, no focal deficits noted.  Skin: Warm, no lesions or rashes    Lab Results:  CBC       ProBNP No results found for: "PROBNP"  Imaging: DG Chest 2 View  Result Date: 10/24/2022 CLINICAL DATA:  Cough. EXAM: CHEST - 2 VIEW COMPARISON:  10/09/2021. FINDINGS: The cardiac silhouette, mediastinal and hilar contours are within normal limits and stable. Stable tortuosity and calcification of the thoracic aorta. Chronic emphysematous changes and pulmonary scarring without definite acute overlying pulmonary process. No definite infiltrates or effusions. No pneumothorax. The bony thorax is intact. IMPRESSION: Chronic lung changes but no acute overlying pulmonary process. Electronically Signed   By: Marijo Sanes M.D.   On: 10/24/2022 11:43    albuterol (PROVENTIL) (2.5 MG/3ML) 0.083% nebulizer solution 2.5 mg     Date Action Dose Route User   10/24/2022 1029 Given 2.5 mg Nebulization Nolon Stalls, Kimber Relic, RN          Latest Ref Rng & Units 11/19/2017   11:24 AM  PFT Results  FVC-Pre L 2.16   FVC-Predicted Pre % 57   FVC-Post L 3.09   FVC-Predicted Post % 81   Pre FEV1/FVC % % 43   Post FEV1/FCV % % 40   FEV1-Pre L 0.93   FEV1-Predicted Pre % 34   FEV1-Post L 1.23    DLCO uncorrected ml/min/mmHg 15.25   DLCO UNC% % 53   DLVA Predicted % 82   TLC L 7.55   TLC % Predicted % 117   RV % Predicted % 221     No results found for: "NITRICOXIDE"      Assessment & Plan:   Acute bronchitis with COPD (HCC) Slow to resolve exacerbation.  Check chest x-ray today. Will treat with empiric antibiotics and steroid.  Albuterol nebulizer given in office. Continue on Breztri twice daily  Plan  Patient Instructions  Chest xray today  Zpack take as directed.  Prednisone taper over next week.  Hold Methotrexate for 1 week.  Delsym 2 tsp Twice daily  As needed  cough.  Continue on Breztri 2 puffs Twice daily  with spacer, rinse after use.  Mycelex five times a day for 1 week.  Albuterol inhaler As needed   High protein diet .  Follow up with PCP for weight loss.  Follow up with Dr. Valeta Harms in 6-8 weeks and As needed   Please contact office for sooner follow up if symptoms do not improve or worsen or seek emergency care     '   Oral candida Oral candidiasis noted.  Patient is to begin Mycelex atrocious for 7 days.  Care discussed in detail.  Add spacer.  Lung nodule Scattered lung nodules noted on CT chest.  Patient to states in the lung cancer screening program.  Continue with surveillance CT as planned.  There was some very small scattered nodularity.  A postinflammatory or infectious.  Patient has been treated with antibiotics.  Will follow on CT surveillance.     Rexene Edison, NP 10/24/2022

## 2022-10-24 NOTE — Patient Instructions (Addendum)
Chest xray today  Zpack take as directed.  Prednisone taper over next week.  Hold Methotrexate for 1 week.  Delsym 2 tsp Twice daily  As needed  cough.  Continue on Breztri 2 puffs Twice daily  with spacer, rinse after use.  Mycelex five times a day for 1 week.  Albuterol inhaler As needed   High protein diet .  Follow up with PCP for weight loss.  Follow up with Dr. Valeta Harms in 6-8 weeks and As needed   Please contact office for sooner follow up if symptoms do not improve or worsen or seek emergency care

## 2022-11-18 DIAGNOSIS — L821 Other seborrheic keratosis: Secondary | ICD-10-CM | POA: Diagnosis not present

## 2022-11-18 DIAGNOSIS — L400 Psoriasis vulgaris: Secondary | ICD-10-CM | POA: Diagnosis not present

## 2022-11-18 DIAGNOSIS — Z79899 Other long term (current) drug therapy: Secondary | ICD-10-CM | POA: Diagnosis not present

## 2022-12-05 ENCOUNTER — Ambulatory Visit: Payer: Medicare HMO | Admitting: Pulmonary Disease

## 2022-12-06 ENCOUNTER — Encounter: Payer: Self-pay | Admitting: Pulmonary Disease

## 2022-12-06 ENCOUNTER — Ambulatory Visit (INDEPENDENT_AMBULATORY_CARE_PROVIDER_SITE_OTHER): Payer: Medicare HMO | Admitting: Pulmonary Disease

## 2022-12-06 VITALS — BP 110/58 | HR 63 | Ht 67.0 in | Wt 153.0 lb

## 2022-12-06 DIAGNOSIS — Z87891 Personal history of nicotine dependence: Secondary | ICD-10-CM

## 2022-12-06 DIAGNOSIS — J449 Chronic obstructive pulmonary disease, unspecified: Secondary | ICD-10-CM

## 2022-12-06 DIAGNOSIS — R911 Solitary pulmonary nodule: Secondary | ICD-10-CM | POA: Diagnosis not present

## 2022-12-06 DIAGNOSIS — J432 Centrilobular emphysema: Secondary | ICD-10-CM

## 2022-12-06 NOTE — Patient Instructions (Addendum)
Thank you for visiting Dr. [name] at [facility]. Today we recommend the following:  Orders Placed This Encounter  Procedures   CT Chest Wo Contrast   Return in about 9 months (around 09/07/2023) for w/ Rubye Oaks, NP - COPD Follow up.    Please do your part to reduce the spread of COVID-19.

## 2022-12-06 NOTE — Progress Notes (Signed)
Synopsis: Referred in Jan 2023 for lung nodule by Adrian Prince, MD  Subjective:   PATIENT ID: Harry Lucas: Harry Lucas, MRN: [id number]  Chief Complaint  Patient presents with   Follow-up    F/up    This is a 77 year old gentleman, past medical history of coronary disease.  Patient is a former smoker who quit in 2020.Patient had a CT scan of the chest on 08/28/2021, patient was found to have diffuse basilar predominant tree-in-bud nodularity new since previous CT imaging favoring an infectious bronchiolitis.  Has a few smaller bilateral pulmonary nodules.  Initial lung cancer screening CT was in January 2021.  Patient had a lung cancer screening CT then which was read as a lung RADS 2.  Was found to have small scattered pulmonary nodules largest of which was 5.7 mm.  OV 10/24/2021: Here today for follow-up after recent acute visit with nurse practitioner in the office.  Respiratory symptoms are better.  He had what appears to be an acute exacerbation of underlying lung disease with shortness of breath upper respiratory symptoms.  Change his inhalers from Trelegy to [facility] which she likes better.  Got financial aid assistance application filled out to hopefully help with cost.  Also using antihistamines, allergy medications and nasal sprays which have all improved his symptoms.  He feels like he is back to normal.  He is going back out to play golf at least once a week.  OV 08/28/2022: Here today for follow-up.Patient had recent lung cancer screening CT completed in January 2024.  Felt to be lung RADS 2, small clustered areas of nodules within the right middle lobe and lingula all less than 4 mm in size felt to be inflammatory.  He has no respiratory complaints today.  Has been doing really well with the [facility].  Wife recently diagnosed with vascular dementia.  Unfortunately he started smoking again he is smoking about 3 to 5 cigarettes/day  OV 12/06/2022: Here today for  follow-up.  Doing okay.  Still using Breztri inhaler.  Has been able to quit smoking.  He is breathing better.  Minimal cough and sputum production.    Past Medical History:  Diagnosis Date   Coronary artery disease    Eczema      Family History  Problem Relation Age of Onset   Hypertension Other      No past surgical history on file.  Social History   Socioeconomic History   Marital status: Married    Spouse name: Not on file   Number of children: Not on file   Years of education: Not on file   Highest education level: Not on file  Occupational History   Not on file  Tobacco Use   Smoking status: Former    Types: Cigarettes    Quit date: 07/13/2019    Years since quitting: 3.4    Passive exposure: Past   Smokeless tobacco: Never  Vaping Use   Vaping Use: Never used  Substance and Sexual Activity   Alcohol use: Not Currently   Drug use: Never   Sexual activity: Not on file  Other Topics Concern   Not on file  Social History Narrative   Not on file   Social Determinants of Health   Financial Resource Strain: Not on file  Food Insecurity: Not on file  Transportation Needs: Not on file  Physical Activity: Not on file  Stress: Not on file  Social Connections: Not on file  Intimate Partner Violence: Not  on file     No Known Allergies   Outpatient Medications Prior to Visit  Medication Sig Dispense Refill   acetaminophen (TYLENOL) 500 MG tablet Take 1,000 mg by mouth every 6 (six) hours as needed for headache (pain).     albuterol (VENTOLIN HFA) 108 (90 Base) MCG/ACT inhaler Inhale 1-2 puffs into the lungs every 6 (six) hours as needed for wheezing or shortness of breath. 1 each 11   azelastine (ASTELIN) 0.1 % nasal spray Place 2 sprays into both nostrils 2 (two) times daily. Use in each nostril as directed 30 mL 2   betamethasone dipropionate 0.05 % lotion Apply 1 application topically daily as needed (psoriasis/eczema).     Budeson-Glycopyrrol-Formoterol  (BREZTRI AEROSPHERE) 160-9-4.8 MCG/ACT AERO Inhale 2 puffs into the lungs in the morning and at bedtime. 10.7 g 11   cholecalciferol (VITAMIN D) 25 MCG (1000 UNIT) tablet Take 1,000 Units by mouth every morning.     clotrimazole (MYCELEX) 10 MG troche Take 1 tablet (10 mg total) by mouth 5 (five) times daily. 35 Troche 0   FLUoxetine (PROZAC) 40 MG capsule Take 40 mg by mouth every morning.     fluticasone (FLONASE) 50 MCG/ACT nasal spray Place 1 spray into both nostrils daily. 16 g 2   folic acid (FOLVITE) 1 MG tablet Take 1 mg by mouth every morning.     hydrocortisone 2.5 % cream Apply 1 application topically 2 (two) times daily as needed (psoriasis/eczema).     levothyroxine (SYNTHROID) 150 MCG tablet Take 150 mcg by mouth daily before breakfast.     losartan (COZAAR) 25 MG tablet Take 25 mg by mouth daily.     methotrexate (RHEUMATREX) 2.5 MG tablet Take 6 tablets (15 mg total) by mouth every Sunday. (Patient taking differently: Take 15 mg by mouth every Sunday. 5 tabs every Sunday) 4 tablet 0   predniSONE (DELTASONE) 10 MG tablet 4 tabs for 2 days, then 3 tabs for 2 days, 2 tabs for 2 days, then 1 tab for 2 days, then stop 20 tablet 0   simvastatin (ZOCOR) 40 MG tablet Take 40 mg by mouth every morning.     Spacer/Aero-Holding Chambers DEVI 2 puffs by Does not apply route 2 (two) times daily. Use with inhaler twice daily 1 each 0   vitamin B-12 (CYANOCOBALAMIN) 1000 MCG tablet Take 1,000 mcg by mouth every morning.     loratadine (CLARITIN) 10 MG tablet Take 1 tablet (10 mg total) by mouth daily. (Patient not taking: Reported on 10/24/2022) 30 tablet 5   montelukast (SINGULAIR) 10 MG tablet Take 10 mg by mouth every morning. (Patient not taking: Reported on 10/24/2022)     Spacer/Aero-Holding Rudean Curt Use with inhaler. (Patient not taking: Reported on 08/28/2022) 1 each 2   No facility-administered medications prior to visit.    Review of Systems  Constitutional:  Negative for chills,  fever, malaise/fatigue and weight loss.  HENT:  Negative for hearing loss, sore throat and tinnitus.   Eyes:  Negative for blurred vision and double vision.  Respiratory:  Positive for cough and shortness of breath. Negative for hemoptysis, sputum production, wheezing and stridor.   Cardiovascular:  Negative for chest pain, palpitations, orthopnea, leg swelling and PND.  Gastrointestinal:  Negative for abdominal pain, constipation, diarrhea, heartburn, nausea and vomiting.  Genitourinary:  Negative for dysuria, hematuria and urgency.  Musculoskeletal:  Negative for joint pain and myalgias.  Skin:  Negative for itching and rash.  Neurological:  Negative for dizziness,  tingling, weakness and headaches.  Endo/Heme/Allergies:  Negative for environmental allergies. Does not bruise/bleed easily.  Psychiatric/Behavioral:  Negative for depression. The patient is not nervous/anxious and does not have insomnia.   All other systems reviewed and are negative.    Objective:  Physical Exam Vitals reviewed.  Constitutional:      General: He is not in acute distress.    Appearance: He is well-developed.  HENT:     Head: Normocephalic and atraumatic.  Eyes:     General: No scleral icterus.    Conjunctiva/sclera: Conjunctivae normal.     Pupils: Pupils are equal, round, and reactive to light.  Neck:     Vascular: No JVD.     Trachea: No tracheal deviation.  Cardiovascular:     Rate and Rhythm: Normal rate and regular rhythm.     Heart sounds: Normal heart sounds. No murmur heard. Pulmonary:     Effort: Pulmonary effort is normal. No tachypnea, accessory muscle usage or respiratory distress.     Breath sounds: No stridor. No wheezing, rhonchi or rales.     Comments: Diminished breath sounds bilaterally Abdominal:     General: There is no distension.     Palpations: Abdomen is soft.     Tenderness: There is no abdominal tenderness.  Musculoskeletal:        General: No tenderness.     Cervical  back: Neck supple.  Lymphadenopathy:     Cervical: No cervical adenopathy.  Skin:    General: Skin is warm and dry.     Capillary Refill: Capillary refill takes less than 2 seconds.     Findings: No rash.  Neurological:     Mental Status: He is alert and oriented to person, place, and time.  Psychiatric:        Behavior: Behavior normal.      Vitals:   12/06/22 1316  BP: (!) 110/58  Pulse: 63  SpO2: 98%  Weight: 153 lb (69.4 kg)  Height: 5\' 7"  (1.702 m)    98% on RA BMI Readings from Last 3 Encounters:  12/06/22 23.96 kg/m  10/24/22 22.12 kg/m  08/28/22 23.74 kg/m   Wt Readings from Last 3 Encounters:  12/06/22 153 lb (69.4 kg)  10/24/22 141 lb 3.2 oz (64 kg)  08/28/22 151 lb 9.6 oz (68.8 kg)     CBC    Component Value Date/Time   WBC 15.3 (H) 08/12/2021 0430   RBC 3.44 (L) 08/12/2021 0430   HGB 11.5 (L) 08/12/2021 0430   HCT 34.9 (L) 08/12/2021 0430   PLT 246 08/12/2021 0430   MCV 101.5 (H) 08/12/2021 0430   MCH 33.4 08/12/2021 0430   MCHC 33.0 08/12/2021 0430   RDW 13.3 08/12/2021 0430   LYMPHSABS 0.2 (L) 08/12/2021 0430   MONOABS 0.4 08/12/2021 0430   EOSABS 0.0 08/12/2021 0430   BASOSABS 0.0 08/12/2021 0430     Chest Imaging:  08/28/2021 CT chest: Lower lobe areas of tree-in-bud nodularity.  Smaller scattered stable pulmonary nodules from previous lung cancer screening CT in 2021. The patient's images have been independently reviewed by me.    Lung cancer screening CT January 2024: Few scattered small areas of nodular infiltrate, less than 4 mm in size appears inflammatory. Repeat noncontrast CT chest for continued lung cancer screening in 1 year The patient's images have been independently reviewed by me.    Pulmonary Functions Testing Results:    Latest Ref Rng & Units 11/19/2017   11:24 AM  PFT Results  FVC-Pre L 2.16   FVC-Predicted Pre % 57   FVC-Post L 3.09   FVC-Predicted Post % 81   Pre FEV1/FVC % % 43   Post FEV1/FCV % % 40    FEV1-Pre L 0.93   FEV1-Predicted Pre % 34   FEV1-Post L 1.23   DLCO uncorrected ml/min/mmHg 15.25   DLCO UNC% % 53   DLVA Predicted % 82   TLC L 7.55   TLC % Predicted % 117   RV % Predicted % 221     FeNO:   Pathology:   Echocardiogram:   Heart Catheterization:     Assessment & Plan:     ICD-10-CM   1. Lung nodule  R91.1 CT Chest Wo Contrast    2. Chronic obstructive pulmonary disease, unspecified COPD type (HCC)  J44.9     3. Centrilobular emphysema (HCC)  J43.2     4. Former smoker  Z87.891        Discussion:  This is a 77 year old gentleman, 50+ pack year history of smoking, previously quit but unfortunately has restarted.  He today states that he has quit as of March 2024.  Repeat CT imaging of the chest that was completed in January shows small scattered nodules that look inflammatory.  Plan: Continue Breztri inhaler Repeat CT chest in January 2025. Not a candidate for lung cancer screening as he is now 77 years old. Continue albuterol as needed  Return to clinic to see TP, NP in 9 months.    Current Outpatient Medications:    acetaminophen (TYLENOL) 500 MG tablet, Take 1,000 mg by mouth every 6 (six) hours as needed for headache (pain)., Disp: , Rfl:    albuterol (VENTOLIN HFA) 108 (90 Base) MCG/ACT inhaler, Inhale 1-2 puffs into the lungs every 6 (six) hours as needed for wheezing or shortness of breath., Disp: 1 each, Rfl: 11   azelastine (ASTELIN) 0.1 % nasal spray, Place 2 sprays into both nostrils 2 (two) times daily. Use in each nostril as directed, Disp: 30 mL, Rfl: 2   betamethasone dipropionate 0.05 % lotion, Apply 1 application topically daily as needed (psoriasis/eczema)., Disp: , Rfl:    Budeson-Glycopyrrol-Formoterol (BREZTRI AEROSPHERE) 160-9-4.8 MCG/ACT AERO, Inhale 2 puffs into the lungs in the morning and at bedtime., Disp: 10.7 g, Rfl: 11   cholecalciferol (VITAMIN D) 25 MCG (1000 UNIT) tablet, Take 1,000 Units by mouth every morning.,  Disp: , Rfl:    clotrimazole (MYCELEX) 10 MG troche, Take 1 tablet (10 mg total) by mouth 5 (five) times daily., Disp: 35 Troche, Rfl: 0   FLUoxetine (PROZAC) 40 MG capsule, Take 40 mg by mouth every morning., Disp: , Rfl:    fluticasone (FLONASE) 50 MCG/ACT nasal spray, Place 1 spray into both nostrils daily., Disp: 16 g, Rfl: 2   folic acid (FOLVITE) 1 MG tablet, Take 1 mg by mouth every morning., Disp: , Rfl:    hydrocortisone 2.5 % cream, Apply 1 application topically 2 (two) times daily as needed (psoriasis/eczema)., Disp: , Rfl:    levothyroxine (SYNTHROID) 150 MCG tablet, Take 150 mcg by mouth daily before breakfast., Disp: , Rfl:    losartan (COZAAR) 25 MG tablet, Take 25 mg by mouth daily., Disp: , Rfl:    methotrexate (RHEUMATREX) 2.5 MG tablet, Take 6 tablets (15 mg total) by mouth every Sunday. (Patient taking differently: Take 15 mg by mouth every Sunday. 5 tabs every Sunday), Disp: 4 tablet, Rfl: 0   predniSONE (DELTASONE) 10 MG tablet, 4 tabs for  2 days, then 3 tabs for 2 days, 2 tabs for 2 days, then 1 tab for 2 days, then stop, Disp: 20 tablet, Rfl: 0   simvastatin (ZOCOR) 40 MG tablet, Take 40 mg by mouth every morning., Disp: , Rfl:    Spacer/Aero-Holding Chambers DEVI, 2 puffs by Does not apply route 2 (two) times daily. Use with inhaler twice daily, Disp: 1 each, Rfl: 0   vitamin B-12 (CYANOCOBALAMIN) 1000 MCG tablet, Take 1,000 mcg by mouth every morning., Disp: , Rfl:    loratadine (CLARITIN) 10 MG tablet, Take 1 tablet (10 mg total) by mouth daily. (Patient not taking: Reported on 10/24/2022), Disp: 30 tablet, Rfl: 5   montelukast (SINGULAIR) 10 MG tablet, Take 10 mg by mouth every morning. (Patient not taking: Reported on 10/24/2022), Disp: , Rfl:    Josephine Igo, DO [facility] 12/06/2022 1:55 PM

## 2022-12-23 ENCOUNTER — Encounter: Payer: Self-pay | Admitting: Pulmonary Disease

## 2022-12-23 ENCOUNTER — Ambulatory Visit: Payer: Medicare HMO | Admitting: Pulmonary Disease

## 2022-12-23 VITALS — BP 98/64 | HR 103 | Ht 67.0 in | Wt 148.0 lb

## 2022-12-23 DIAGNOSIS — J440 Chronic obstructive pulmonary disease with acute lower respiratory infection: Secondary | ICD-10-CM | POA: Diagnosis not present

## 2022-12-23 DIAGNOSIS — J209 Acute bronchitis, unspecified: Secondary | ICD-10-CM | POA: Diagnosis not present

## 2022-12-23 MED ORDER — CLOTRIMAZOLE 10 MG MT TROC: 10.0000 mg | Freq: Every day | OROMUCOSAL | 0 refills | Status: AC

## 2022-12-23 MED ORDER — PREDNISONE 20 MG PO TABS: 20.0000 mg | ORAL_TABLET | Freq: Every day | ORAL | 0 refills | Status: DC

## 2022-12-23 MED ORDER — AZITHROMYCIN 250 MG PO TABS: ORAL_TABLET | ORAL | 0 refills | Status: DC

## 2022-12-23 NOTE — Progress Notes (Signed)
Harry Lucas    [id number]    1945/10/27  Primary Care Physician:South, Jeannett Senior, MD  Referring Physician: Adrian Prince, MD [address] [place],  [place] [address]  Chief complaint:    Patient being seen for an acute visit  HPI:  History of obstructive lung disease on Breztri  Was treated for Thrush  about 2 months ago  Has been coughing for about a week Minimal phlegm production Feels the coughing takes his breath away  Continues to use [place] on a regular basis, rinses his mouth soon after use  Breathing is relatively steady  Limited with activities of daily living  Outpatient Encounter Medications as of 12/23/2022  Medication Sig   acetaminophen (TYLENOL) 500 MG tablet Take 1,000 mg by mouth every 6 (six) hours as needed for headache (pain).   albuterol (VENTOLIN HFA) 108 (90 Base) MCG/ACT inhaler Inhale 1-2 puffs into the lungs every 6 (six) hours as needed for wheezing or shortness of breath.   azelastine (ASTELIN) 0.1 % nasal spray Place 2 sprays into both nostrils 2 (two) times daily. Use in each nostril as directed   azithromycin (ZITHROMAX Z-PAK) 250 MG tablet Take 2 tablets day 1 and then 1 daily for 4 days   betamethasone dipropionate 0.05 % lotion Apply 1 application topically daily as needed (psoriasis/eczema).   Budeson-Glycopyrrol-Formoterol (BREZTRI AEROSPHERE) 160-9-4.8 MCG/ACT AERO Inhale 2 puffs into the lungs in the morning and at bedtime.   cholecalciferol (VITAMIN D) 25 MCG (1000 UNIT) tablet Take 1,000 Units by mouth every morning.   clotrimazole (MYCELEX) 10 MG troche Take 1 tablet (10 mg total) by mouth 5 (five) times daily.   clotrimazole (MYCELEX) 10 MG troche Take 1 tablet (10 mg total) by mouth 5 (five) times daily for 7 days.   FLUoxetine (PROZAC) 40 MG capsule Take 40 mg by mouth every morning.   fluticasone (FLONASE) 50 MCG/ACT nasal spray Place 1 spray into both nostrils daily.   folic acid (FOLVITE) 1 MG tablet Take 1 mg  by mouth every morning.   hydrocortisone 2.5 % cream Apply 1 application topically 2 (two) times daily as needed (psoriasis/eczema).   levothyroxine (SYNTHROID) 150 MCG tablet Take 150 mcg by mouth daily before breakfast.   loratadine (CLARITIN) 10 MG tablet Take 1 tablet (10 mg total) by mouth daily.   losartan (COZAAR) 25 MG tablet Take 25 mg by mouth daily.   methotrexate (RHEUMATREX) 2.5 MG tablet Take 6 tablets (15 mg total) by mouth every Sunday. (Patient taking differently: Take 15 mg by mouth every Sunday. 5 tabs every Sunday)   montelukast (SINGULAIR) 10 MG tablet Take 10 mg by mouth every morning.   predniSONE (DELTASONE) 10 MG tablet 4 tabs for 2 days, then 3 tabs for 2 days, 2 tabs for 2 days, then 1 tab for 2 days, then stop   predniSONE (DELTASONE) 20 MG tablet Take 1 tablet (20 mg total) by mouth daily with breakfast.   simvastatin (ZOCOR) 40 MG tablet Take 40 mg by mouth every morning.   Spacer/Aero-Holding Chambers DEVI 2 puffs by Does not apply route 2 (two) times daily. Use with inhaler twice daily   vitamin B-12 (CYANOCOBALAMIN) 1000 MCG tablet Take 1,000 mcg by mouth every morning.   No facility-administered encounter medications on file as of 12/23/2022.    Allergies as of 12/23/2022   (No Known Allergies)    Past Medical History:  Diagnosis Date   Coronary artery disease  Eczema     No past surgical history on file.  Family History  Problem Relation Age of Onset   Hypertension Other     Social History   Socioeconomic History   Marital status: Married    Spouse name: Not on file   Number of children: Not on file   Years of education: Not on file   Highest education level: Not on file  Occupational History   Not on file  Tobacco Use   Smoking status: Former    Types: Cigarettes    Quit date: 07/13/2019    Years since quitting: 3.4    Passive exposure: Past   Smokeless tobacco: Never  Vaping Use   Vaping Use: Never used  Substance and Sexual  Activity   Alcohol use: Not Currently   Drug use: Never   Sexual activity: Not on file  Other Topics Concern   Not on file  Social History Narrative   Not on file   Social Determinants of Health   Financial Resource Strain: Not on file  Food Insecurity: Not on file  Transportation Needs: Not on file  Physical Activity: Not on file  Stress: Not on file  Social Connections: Not on file  Intimate Partner Violence: Not on file    Review of Systems  Respiratory:  Positive for shortness of breath.     Vitals:   12/23/22 0951  BP: 98/64  Pulse: (!) 103  SpO2: 95%     Physical Exam Constitutional:      Appearance: Normal appearance.  HENT:     Head: Normocephalic.     Mouth/Throat:     Mouth: Mucous membranes are moist.  Eyes:     General: No scleral icterus. Cardiovascular:     Rate and Rhythm: Normal rate and regular rhythm.     Heart sounds: No murmur heard.    No friction rub.  Pulmonary:     Effort: No respiratory distress.     Breath sounds: No stridor. No wheezing or rhonchi.     Comments: Poor air movement bilaterally Musculoskeletal:     Cervical back: No rigidity or tenderness.  Neurological:     Mental Status: He is alert.  Psychiatric:        Mood and Affect: Mood normal.    Data Reviewed: Most recent chest x-ray 10/24/2022-chronic changes, no acute infiltrate  Assessment:  Obstructive lung disease  COPD with exacerbation  Thrush  Plan/Recommendations:  Prescription for Mycelex for 7 days  Prescription for azithromycin  Prescription for prednisone 20 mg daily for 7 days  Encouraged to call with significant concerns  Keep follow-up appointment   Virl Diamond MD [facility] 12/23/2022, 10:04 AM  CC: Adrian Prince, MD

## 2022-12-23 NOTE — Patient Instructions (Signed)
Prescription for Mycelex troches  Prescription for azithromycin  Prescription for prednisone  Continue to ensure that you rinse your mouth after use of Breztri  Follow-up as previously scheduled

## 2022-12-31 DIAGNOSIS — I2510 Atherosclerotic heart disease of native coronary artery without angina pectoris: Secondary | ICD-10-CM | POA: Diagnosis not present

## 2022-12-31 DIAGNOSIS — N1831 Chronic kidney disease, stage 3a: Secondary | ICD-10-CM | POA: Diagnosis not present

## 2022-12-31 DIAGNOSIS — I700 Atherosclerosis of aorta: Secondary | ICD-10-CM | POA: Diagnosis not present

## 2022-12-31 DIAGNOSIS — R911 Solitary pulmonary nodule: Secondary | ICD-10-CM | POA: Diagnosis not present

## 2022-12-31 DIAGNOSIS — E039 Hypothyroidism, unspecified: Secondary | ICD-10-CM | POA: Diagnosis not present

## 2022-12-31 DIAGNOSIS — I714 Abdominal aortic aneurysm, without rupture, unspecified: Secondary | ICD-10-CM | POA: Diagnosis not present

## 2022-12-31 DIAGNOSIS — J449 Chronic obstructive pulmonary disease, unspecified: Secondary | ICD-10-CM | POA: Diagnosis not present

## 2022-12-31 DIAGNOSIS — F331 Major depressive disorder, recurrent, moderate: Secondary | ICD-10-CM | POA: Diagnosis not present

## 2022-12-31 DIAGNOSIS — I739 Peripheral vascular disease, unspecified: Secondary | ICD-10-CM | POA: Diagnosis not present

## 2022-12-31 DIAGNOSIS — I129 Hypertensive chronic kidney disease with stage 1 through stage 4 chronic kidney disease, or unspecified chronic kidney disease: Secondary | ICD-10-CM | POA: Diagnosis not present

## 2023-02-06 ENCOUNTER — Ambulatory Visit: Payer: Medicare HMO | Admitting: Student

## 2023-02-06 ENCOUNTER — Ambulatory Visit (INDEPENDENT_AMBULATORY_CARE_PROVIDER_SITE_OTHER): Payer: Medicare HMO | Admitting: Student

## 2023-02-06 ENCOUNTER — Encounter: Payer: Self-pay | Admitting: Student

## 2023-02-06 ENCOUNTER — Ambulatory Visit (INDEPENDENT_AMBULATORY_CARE_PROVIDER_SITE_OTHER): Payer: Medicare HMO

## 2023-02-06 VITALS — BP 86/46 | HR 88 | Temp 98.4°F | Ht 67.0 in | Wt 149.0 lb

## 2023-02-06 DIAGNOSIS — J189 Pneumonia, unspecified organism: Secondary | ICD-10-CM

## 2023-02-06 DIAGNOSIS — J441 Chronic obstructive pulmonary disease with (acute) exacerbation: Secondary | ICD-10-CM | POA: Diagnosis not present

## 2023-02-06 DIAGNOSIS — R051 Acute cough: Secondary | ICD-10-CM

## 2023-02-06 MED ORDER — AMOXICILLIN-POT CLAVULANATE 875-125 MG PO TABS: 1.0000 | ORAL_TABLET | Freq: Two times a day (BID) | ORAL | 0 refills | Status: AC

## 2023-02-06 MED ORDER — PREDNISONE 10 MG PO TABS
ORAL_TABLET | ORAL | 0 refills | Status: DC
Start: 2023-02-06 — End: 2023-05-28

## 2023-02-06 MED ORDER — AZITHROMYCIN 250 MG PO TABS: ORAL_TABLET | ORAL | 0 refills | Status: DC

## 2023-02-06 NOTE — Patient Instructions (Addendum)
-   augmentin, azithromycin as directed - prednisone taper - drink plenty of water - if worsening lightheadedness despite above measures or confused, increased work of breathing then head to the emergency dept - otherwise see you in 8 weeks!

## 2023-02-06 NOTE — Progress Notes (Signed)
Synopsis: Referred for trouble breathing by Adrian Prince, MD  Subjective:   PATIENT ID: Harry Lucas: male DOB: 01/04/1946, MRN: [id number]  Chief Complaint  Patient presents with   Acute Visit    Increased cough with yellow sputum, increased SOB and wheezing over the past 2 wks.     77yM with COPD on breztri, eczema on methotrexate for years   Increased cough with yellowish sputum, increased dyspnea/wheeze. No fever.   He does feel lightheaded at times. Spent a lot of time golfing yesterday in the heat. No n/v/d.  Otherwise pertinent review of systems is negative.  Past Medical History:  Diagnosis Date   Coronary artery disease    Eczema      Family History  Problem Relation Age of Onset   Hypertension Other      No past surgical history on file.  Social History   Socioeconomic History   Marital status: Married    Spouse name: Not on file   Number of children: Not on file   Years of education: Not on file   Highest education level: Not on file  Occupational History   Not on file  Tobacco Use   Smoking status: Former    Types: Cigarettes    Quit date: 07/13/2019    Years since quitting: 3.5    Passive exposure: Past   Smokeless tobacco: Never  Vaping Use   Vaping Use: Never used  Substance and Sexual Activity   Alcohol use: Not Currently   Drug use: Never   Sexual activity: Not on file  Other Topics Concern   Not on file  Social History Narrative   Not on file   Social Determinants of Health   Financial Resource Strain: Not on file  Food Insecurity: Not on file  Transportation Needs: Not on file  Physical Activity: Not on file  Stress: Not on file  Social Connections: Not on file  Intimate Partner Violence: Not on file     No Known Allergies   Outpatient Medications Prior to Visit  Medication Sig Dispense Refill   acetaminophen (TYLENOL) 500 MG tablet Take 1,000 mg by mouth every 6 (six) hours as needed for headache (pain).      albuterol (VENTOLIN HFA) 108 (90 Base) MCG/ACT inhaler Inhale 1-2 puffs into the lungs every 6 (six) hours as needed for wheezing or shortness of breath. 1 each 11   azelastine (ASTELIN) 0.1 % nasal spray Place 2 sprays into both nostrils 2 (two) times daily. Use in each nostril as directed 30 mL 2   betamethasone dipropionate 0.05 % lotion Apply 1 application topically daily as needed (psoriasis/eczema).     Budeson-Glycopyrrol-Formoterol (BREZTRI AEROSPHERE) 160-9-4.8 MCG/ACT AERO Inhale 2 puffs into the lungs in the morning and at bedtime. 10.7 g 11   cholecalciferol (VITAMIN D) 25 MCG (1000 UNIT) tablet Take 1,000 Units by mouth every morning.     clotrimazole (MYCELEX) 10 MG troche Take 1 tablet (10 mg total) by mouth 5 (five) times daily. 35 Troche 0   FLUoxetine (PROZAC) 40 MG capsule Take 40 mg by mouth every morning.     fluticasone (FLONASE) 50 MCG/ACT nasal spray Place 1 spray into both nostrils daily. 16 g 2   folic acid (FOLVITE) 1 MG tablet Take 1 mg by mouth every morning.     hydrocortisone 2.5 % cream Apply 1 application topically 2 (two) times daily as needed (psoriasis/eczema).     levothyroxine (SYNTHROID) 150 MCG tablet Take 150 mcg  by mouth daily before breakfast.     loratadine (CLARITIN) 10 MG tablet Take 1 tablet (10 mg total) by mouth daily. 30 tablet 5   losartan (COZAAR) 25 MG tablet Take 25 mg by mouth daily.     methotrexate (RHEUMATREX) 2.5 MG tablet Take 6 tablets (15 mg total) by mouth every Sunday. (Patient taking differently: Take 15 mg by mouth every Sunday. 5 tabs every Sunday) 4 tablet 0   montelukast (SINGULAIR) 10 MG tablet Take 10 mg by mouth every morning.     simvastatin (ZOCOR) 40 MG tablet Take 40 mg by mouth every morning.     Spacer/Aero-Holding Chambers DEVI 2 puffs by Does not apply route 2 (two) times daily. Use with inhaler twice daily 1 each 0   vitamin B-12 (CYANOCOBALAMIN) 1000 MCG tablet Take 1,000 mcg by mouth every morning.      azithromycin (ZITHROMAX Z-PAK) 250 MG tablet Take 2 tablets day 1 and then 1 daily for 4 days 6 each 0   predniSONE (DELTASONE) 10 MG tablet 4 tabs for 2 days, then 3 tabs for 2 days, 2 tabs for 2 days, then 1 tab for 2 days, then stop 20 tablet 0   predniSONE (DELTASONE) 20 MG tablet Take 1 tablet (20 mg total) by mouth daily with breakfast. 7 tablet 0   No facility-administered medications prior to visit.       Objective:   Physical Exam:  General appearance: 77 y.o., male, NAD, conversant  Eyes: anicteric sclerae; PERRL, tracking appropriately HENT: NCAT; MMM Neck: Trachea midline; no lymphadenopathy, no JVD Lungs: Rhonchi bl, with normal respiratory effort CV: RRR, no murmur  Abdomen: Soft, non-tender; non-distended, BS present  Extremities: No peripheral edema, warm Skin: Normal turgor and texture; no rash Psych: Appropriate affect Neuro: Alert and oriented to person and place, no focal deficit     Vitals:   02/06/23 1401  BP: (!) 86/46  Pulse: 88  Temp: 98.4 F (36.9 C)  TempSrc: Oral  SpO2: 96%  Weight: 149 lb (67.6 kg)  Height: 5\' 7"  (1.702 m)   96% on RA BMI Readings from Last 3 Encounters:  02/06/23 23.34 kg/m  12/23/22 23.18 kg/m  12/06/22 23.96 kg/m   Wt Readings from Last 3 Encounters:  02/06/23 149 lb (67.6 kg)  12/23/22 148 lb (67.1 kg)  12/06/22 153 lb (69.4 kg)     CBC    Component Value Date/Time   WBC 15.3 (H) 08/12/2021 0430   RBC 3.44 (L) 08/12/2021 0430   HGB 11.5 (L) 08/12/2021 0430   HCT 34.9 (L) 08/12/2021 0430   PLT 246 08/12/2021 0430   MCV 101.5 (H) 08/12/2021 0430   MCH 33.4 08/12/2021 0430   MCHC 33.0 08/12/2021 0430   RDW 13.3 08/12/2021 0430   LYMPHSABS 0.2 (L) 08/12/2021 0430   MONOABS 0.4 08/12/2021 0430   EOSABS 0.0 08/12/2021 0430   BASOSABS 0.0 08/12/2021 0430      Chest Imaging: CXR with increased RUL interstitial/alveolar opacities  Pulmonary Functions Testing Results:    Latest Ref Rng & Units  11/19/2017   11:24 AM  PFT Results  FVC-Pre L 2.16   FVC-Predicted Pre % 57   FVC-Post L 3.09   FVC-Predicted Post % 81   Pre FEV1/FVC % % 43   Post FEV1/FCV % % 40   FEV1-Pre L 0.93   FEV1-Predicted Pre % 34   FEV1-Post L 1.23   DLCO uncorrected ml/min/mmHg 15.25   DLCO UNC% % 53  DLVA Predicted % 82   TLC L 7.55   TLC % Predicted % 117   RV % Predicted % 221        Assessment & Plan:   # CAP  # COPD with acute exacerbation # Hypotension  Plan: - augmentin, azithromycin as directed - prednisone taper - drink plenty of water - breztri 2 puff twice daily - albuterol as needed - if worsening lightheadedness despite above measures or confused, increased work of breathing then head to the emergency dept - otherwise see you in 8 weeks!     Omar Person, MD [facility] 02/06/2023 2:16 PM

## 2023-02-08 LAB — COVID-19, FLU A+B AND RSV
Influenza A, NAA: NOT DETECTED
Influenza B, NAA: NOT DETECTED
RSV, NAA: NOT DETECTED
SARS-CoV-2, NAA: NOT DETECTED

## 2023-02-24 ENCOUNTER — Telehealth: Payer: Self-pay | Admitting: Pulmonary Disease

## 2023-02-24 MED ORDER — BEVESPI AEROSPHERE 9-4.8 MCG/ACT IN AERO: 2.0000 | INHALATION_SPRAY | Freq: Two times a day (BID) | RESPIRATORY_TRACT | 11 refills | Status: DC

## 2023-02-24 MED ORDER — BENZONATATE 200 MG PO CAPS: 200.0000 mg | ORAL_CAPSULE | Freq: Three times a day (TID) | ORAL | 1 refills | Status: DC | PRN

## 2023-02-24 MED ORDER — PREDNISONE 10 MG PO TABS: ORAL_TABLET | ORAL | 0 refills | Status: DC

## 2023-02-24 NOTE — Telephone Encounter (Signed)
I called and spoke with the pt and notified of response from Dr [name]  He verbalized understanding  Rxs were sent to pharm including pred taper since he did state that this helped  Nothing further needed

## 2023-02-24 NOTE — Telephone Encounter (Signed)
He's been appropriately covered from abx standpoint and CXR did not show any evidence of pna. Since he's not having any fevers/chills, would not recommend any further abx without being seen. Did he have any improvement while he was on the steroids? If so, can send extended taper (prednisone 10 mg tab) - 4 tabs for 3 days, then 3 tabs for 3 days, 2 tabs for 3 days, then 1 tab for 3 days, then stop. Take in AM with food. Benzonatate 1 capsule Three times a day as needed for cough (200 mg) Please send in Bevespi 2 puffs Twice daily for him to replace the Breztri. This does not contain an ICS so shouldn't cause irritation to his mouth but will help with his breathing/cough. He should use mucinex 600 mg Twice daily.  Thanks.

## 2023-02-24 NOTE — Telephone Encounter (Signed)
Patient states has pneumonia and thrush. Would like to change inhalers. [facility] is [facility]. Patient phone number is [phone].

## 2023-02-24 NOTE — Telephone Encounter (Signed)
I called and spoke with pt- BI pt, seen by Thora Lance last on 02/06/23 for acute visit:   augmentin, azithromycin as directed - prednisone taper - drink plenty of water - if worsening lightheadedness despite above measures or confused, increased work of breathing then head to the emergency dept - otherwise see you in 8 weeks!  He has finished abx and pred and states his cough and wheezing are no better  He stopped breztri bc it was bothering his mouth despite rinsing/brushing after use  He is using albuterol about 2 x per day He is coughing up white sputum  Denies fevers, aches  Scheduled for f/u here 04/02/23 with [facility] but wants appt asap  Nothing available unfortunately for this wk or next  Routing to DOD since BI in procedures all day thanks  No Known Allergies

## 2023-02-25 ENCOUNTER — Telehealth: Payer: Self-pay | Admitting: Pulmonary Disease

## 2023-02-25 NOTE — Telephone Encounter (Signed)
Glycopyrrolate-Formoterol (BEVESPI AEROSPHERE) 9-4.8 MCG/ACT AERO  pt. Calling needing alternative for inhaler its costing him to much

## 2023-02-27 ENCOUNTER — Other Ambulatory Visit (HOSPITAL_COMMUNITY): Payer: Self-pay

## 2023-02-27 NOTE — Telephone Encounter (Signed)
Test claims for alternatives of Bevespi show patient has a deductible to meet making the other alternative in the same class $197.00

## 2023-02-28 NOTE — Telephone Encounter (Signed)
Call pt. Back let him know he stated he was going to keep taking the [facility]

## 2023-04-02 ENCOUNTER — Ambulatory Visit: Payer: Medicare HMO | Admitting: Pulmonary Disease

## 2023-04-02 ENCOUNTER — Encounter: Payer: Self-pay | Admitting: Pulmonary Disease

## 2023-04-02 VITALS — BP 100/60 | HR 89 | Ht 67.0 in | Wt 145.2 lb

## 2023-04-02 DIAGNOSIS — J440 Chronic obstructive pulmonary disease with acute lower respiratory infection: Secondary | ICD-10-CM

## 2023-04-02 DIAGNOSIS — R051 Acute cough: Secondary | ICD-10-CM | POA: Diagnosis not present

## 2023-04-02 DIAGNOSIS — J441 Chronic obstructive pulmonary disease with (acute) exacerbation: Secondary | ICD-10-CM

## 2023-04-02 DIAGNOSIS — J209 Acute bronchitis, unspecified: Secondary | ICD-10-CM

## 2023-04-02 MED ORDER — PREDNISONE 10 MG PO TABS: ORAL_TABLET | ORAL | 0 refills | Status: DC

## 2023-04-02 MED ORDER — ALBUTEROL SULFATE (2.5 MG/3ML) 0.083% IN NEBU: 2.5000 mg | INHALATION_SOLUTION | Freq: Four times a day (QID) | RESPIRATORY_TRACT | 12 refills | Status: AC | PRN

## 2023-04-02 MED ORDER — DOXYCYCLINE HYCLATE 100 MG PO TABS: 100.0000 mg | ORAL_TABLET | Freq: Two times a day (BID) | ORAL | 0 refills | Status: DC

## 2023-04-02 NOTE — Patient Instructions (Signed)
Thank you for visiting Dr. [name] at [facility]. Today we recommend the following:  Meds ordered this encounter  Medications   doxycycline (VIBRA-TABS) 100 MG tablet    Sig: Take 1 tablet (100 mg total) by mouth 2 (two) times daily.    Dispense:  14 tablet    Refill:  0   predniSONE (DELTASONE) 10 MG tablet    Sig: Take 4 tabs by mouth once daily x4 days, then 3 tabs x4 days, 2 tabs x4 days, 1 tab x4 days and stop.    Dispense:  40 tablet    Refill:  0   Return in about 8 years (around 04/02/2031) for with APP.    Please do your part to reduce the spread of COVID-19.

## 2023-04-02 NOTE — Progress Notes (Signed)
Synopsis: Referred in Jan 2023 for lung nodule by Adrian Prince, MD  Subjective:   PATIENT ID: Harry Lucas GENDER: male DOB: 19-Sep-1945, MRN: [id number]  Chief Complaint  Patient presents with   Follow-up    F/up on cough    This is a 77 year old gentleman, past medical history of coronary disease.  Patient is a former smoker who quit in 2020.Patient had a CT scan of the chest on 08/28/2021, patient was found to have diffuse basilar predominant tree-in-bud nodularity new since previous CT imaging favoring an infectious bronchiolitis.  Has a few smaller bilateral pulmonary nodules.  Initial lung cancer screening CT was in January 2021.  Patient had a lung cancer screening CT then which was read as a lung RADS 2.  Was found to have small scattered pulmonary nodules largest of which was 5.7 mm.  OV 10/24/2021: Here today for follow-up after recent acute visit with nurse practitioner in the office.  Respiratory symptoms are better.  He had what appears to be an acute exacerbation of underlying lung disease with shortness of breath upper respiratory symptoms.  Change his inhalers from Trelegy to [facility] which she likes better.  Got financial aid assistance application filled out to hopefully help with cost.  Also using antihistamines, allergy medications and nasal sprays which have all improved his symptoms.  He feels like he is back to normal.  He is going back out to play golf at least once a week.  OV 08/28/2022: Here today for follow-up.Patient had recent lung cancer screening CT completed in January 2024.  Felt to be lung RADS 2, small clustered areas of nodules within the right middle lobe and lingula all less than 4 mm in size felt to be inflammatory.  He has no respiratory complaints today.  Has been doing really well with the [facility].  Wife recently diagnosed with vascular dementia.  Unfortunately he started smoking again he is smoking about 3 to 5 cigarettes/day  OV 12/06/2022: Here today  for follow-up.  Doing okay.  Still using Breztri inhaler.  Has been able to quit smoking.  He is breathing better.  Minimal cough and sputum production.  OV 04/02/2023: Here today for follow-up.  Patient was treated for COPD exacerbation 8 weeks ago.  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  He still has ongoing cough sputum production.  At times he has near fainting type spells with his progressive cough.  Still short of breath today chest tightness and wheezing..    Past Medical History:  Diagnosis Date   Coronary artery disease    Eczema      Family History  Problem Relation Age of Onset   Hypertension Other      No past surgical history on file.  Social History   Socioeconomic History   Marital status: Married    Spouse name: Not on file   Number of children: Not on file   Years of education: Not on file   Highest education level: Not on file  Occupational History   Not on file  Tobacco Use   Smoking status: Former    Current packs/day: 0.00    Types: Cigarettes    Quit date: 07/13/2019    Years since quitting: 3.7    Passive exposure: Past   Smokeless tobacco: Never  Vaping Use   Vaping status: Never Used  Substance and Sexual Activity   Alcohol use: Not Currently   Drug use: Never  Sexual activity: Not on file  Other Topics Concern   Not on file  Social History Narrative   Not on file   Social Determinants of Health   Financial Resource Strain: Not on file  Food Insecurity: Not on file  Transportation Needs: Not on file  Physical Activity: Not on file  Stress: Not on file  Social Connections: Not on file  Intimate Partner Violence: Not on file     No Known Allergies   Outpatient Medications Prior to Visit  Medication Sig Dispense Refill   acetaminophen (TYLENOL) 500 MG tablet Take 1,000 mg by mouth every 6 (six) hours as needed for headache (pain).     albuterol (VENTOLIN HFA) 108 (90 Base) MCG/ACT  inhaler Inhale 1-2 puffs into the lungs every 6 (six) hours as needed for wheezing or shortness of breath. 1 each 11   azelastine (ASTELIN) 0.1 % nasal spray Place 2 sprays into both nostrils 2 (two) times daily. Use in each nostril as directed 30 mL 2   azithromycin (ZITHROMAX) 250 MG tablet Take two tablets today, 1 tablet daily till gone 6 tablet 0   benzonatate (TESSALON) 200 MG capsule Take 1 capsule (200 mg total) by mouth 3 (three) times daily as needed for cough. 30 capsule 1   betamethasone dipropionate 0.05 % lotion Apply 1 application topically daily as needed (psoriasis/eczema).     Budeson-Glycopyrrol-Formoterol (BREZTRI AEROSPHERE) 160-9-4.8 MCG/ACT AERO Inhale 2 puffs into the lungs in the morning and at bedtime. 10.7 g 11   cholecalciferol (VITAMIN D) 25 MCG (1000 UNIT) tablet Take 1,000 Units by mouth every morning.     clotrimazole (MYCELEX) 10 MG troche Take 1 tablet (10 mg total) by mouth 5 (five) times daily. 35 Troche 0   ferrous sulfate 325 (65 FE) MG EC tablet Take 325 mg by mouth 3 (three) times daily with meals.     FLUoxetine (PROZAC) 40 MG capsule Take 40 mg by mouth every morning.     fluticasone (FLONASE) 50 MCG/ACT nasal spray Place 1 spray into both nostrils daily. 16 g 2   folic acid (FOLVITE) 1 MG tablet Take 1 mg by mouth every morning.     Glycopyrrolate-Formoterol (BEVESPI AEROSPHERE) 9-4.8 MCG/ACT AERO Inhale 2 puffs into the lungs 2 (two) times daily. 10.7 g 11   hydrocortisone 2.5 % cream Apply 1 application topically 2 (two) times daily as needed (psoriasis/eczema).     levothyroxine (SYNTHROID) 150 MCG tablet Take 150 mcg by mouth daily before breakfast.     loratadine (CLARITIN) 10 MG tablet Take 1 tablet (10 mg total) by mouth daily. 30 tablet 5   losartan (COZAAR) 25 MG tablet Take 25 mg by mouth daily.     magnesium oxide (MAG-OX) 400 (240 Mg) MG tablet Take 400 mg by mouth daily.     methotrexate (RHEUMATREX) 2.5 MG tablet Take 6 tablets (15 mg total)  by mouth every Sunday. (Patient taking differently: Take 15 mg by mouth every Sunday. 5 tabs every Sunday) 4 tablet 0   montelukast (SINGULAIR) 10 MG tablet Take 10 mg by mouth every morning.     predniSONE (DELTASONE) 10 MG tablet Take 4 tabs for 2 days, then 3 tabs for 2 days, 2 tabs for 2 days, then 1 tab for 2 days, then stop. 20 tablet 0   predniSONE (DELTASONE) 10 MG tablet 4 x 3 days, 3 x 3 days, 2 x 3 days, 1 x 3 days, then stop 30 tablet 0   simvastatin (  ZOCOR) 40 MG tablet Take 40 mg by mouth every morning.     Spacer/Aero-Holding Chambers DEVI 2 puffs by Does not apply route 2 (two) times daily. Use with inhaler twice daily 1 each 0   vitamin B-12 (CYANOCOBALAMIN) 1000 MCG tablet Take 1,000 mcg by mouth every morning.     No facility-administered medications prior to visit.    Review of Systems  Constitutional:  Negative for chills, fever, malaise/fatigue and weight loss.  HENT:  Negative for hearing loss, sore throat and tinnitus.   Eyes:  Negative for blurred vision and double vision.  Respiratory:  Positive for cough, sputum production, shortness of breath and wheezing. Negative for hemoptysis and stridor.   Cardiovascular:  Negative for chest pain, palpitations, orthopnea, leg swelling and PND.  Gastrointestinal:  Negative for abdominal pain, constipation, diarrhea, heartburn, nausea and vomiting.  Genitourinary:  Negative for dysuria, hematuria and urgency.  Musculoskeletal:  Negative for joint pain and myalgias.  Skin:  Negative for itching and rash.  Neurological:  Negative for dizziness, tingling, weakness and headaches.  Endo/Heme/Allergies:  Negative for environmental allergies. Does not bruise/bleed easily.  Psychiatric/Behavioral:  Negative for depression. The patient is not nervous/anxious and does not have insomnia.   All other systems reviewed and are negative.    Objective:  Physical Exam Vitals reviewed.  Constitutional:      General: He is not in acute  distress.    Appearance: He is well-developed.  HENT:     Head: Normocephalic and atraumatic.  Eyes:     General: No scleral icterus.    Conjunctiva/sclera: Conjunctivae normal.     Pupils: Pupils are equal, round, and reactive to light.  Neck:     Vascular: No JVD.     Trachea: No tracheal deviation.  Cardiovascular:     Rate and Rhythm: Normal rate and regular rhythm.     Heart sounds: Normal heart sounds. No murmur heard. Pulmonary:     Effort: Pulmonary effort is normal. No tachypnea, accessory muscle usage or respiratory distress.     Breath sounds: No stridor. Wheezing and rhonchi present. No rales.  Abdominal:     General: There is no distension.     Palpations: Abdomen is soft.     Tenderness: There is no abdominal tenderness.  Musculoskeletal:        General: No tenderness.     Cervical back: Neck supple.  Lymphadenopathy:     Cervical: No cervical adenopathy.  Skin:    General: Skin is warm and dry.     Capillary Refill: Capillary refill takes less than 2 seconds.     Findings: No rash.  Neurological:     Mental Status: He is alert and oriented to person, place, and time.  Psychiatric:        Behavior: Behavior normal.      Vitals:   04/02/23 1032  BP: 100/60  Pulse: 89  SpO2: 94%  Weight: 145 lb 3.2 oz (65.9 kg)  Height: 5\' 7"  (1.702 m)     94% on RA BMI Readings from Last 3 Encounters:  04/02/23 22.74 kg/m  02/06/23 23.34 kg/m  12/23/22 23.18 kg/m   Wt Readings from Last 3 Encounters:  04/02/23 145 lb 3.2 oz (65.9 kg)  02/06/23 149 lb (67.6 kg)  12/23/22 148 lb (67.1 kg)     CBC    Component Value Date/Time   WBC 15.3 (H) 08/12/2021 0430   RBC 3.44 (L) 08/12/2021 0430   HGB 11.5 (L) 08/12/2021  0430   HCT 34.9 (L) 08/12/2021 0430   PLT 246 08/12/2021 0430   MCV 101.5 (H) 08/12/2021 0430   MCH 33.4 08/12/2021 0430   MCHC 33.0 08/12/2021 0430   RDW 13.3 08/12/2021 0430   LYMPHSABS 0.2 (L) 08/12/2021 0430   MONOABS 0.4 08/12/2021  0430   EOSABS 0.0 08/12/2021 0430   BASOSABS 0.0 08/12/2021 0430     Chest Imaging:  08/28/2021 CT chest: Lower lobe areas of tree-in-bud nodularity.  Smaller scattered stable pulmonary nodules from previous lung cancer screening CT in 2021. The patient's images have been independently reviewed by me.    Lung cancer screening CT January 2024: Few scattered small areas of nodular infiltrate, less than 4 mm in size appears inflammatory. Repeat noncontrast CT chest for continued lung cancer screening in 1 year The patient's images have been independently reviewed by me.    Pulmonary Functions Testing Results:    Latest Ref Rng & Units 11/19/2017   11:24 AM  PFT Results  FVC-Pre L 2.16   FVC-Predicted Pre % 57   FVC-Post L 3.09   FVC-Predicted Post % 81   Pre FEV1/FVC % % 43   Post FEV1/FCV % % 40   FEV1-Pre L 0.93   FEV1-Predicted Pre % 34   FEV1-Post L 1.23   DLCO uncorrected ml/min/mmHg 15.25   DLCO UNC% % 53   DLVA Predicted % 82   TLC L 7.55   TLC % Predicted % 117   RV % Predicted % 221     FeNO:   Pathology:   Echocardiogram:   Heart Catheterization:     Assessment & Plan:     ICD-10-CM   1. Acute cough  R05.1     2. Acute bronchitis with COPD (HCC)  J44.0    J20.9     3. COPD with acute exacerbation (HCC)  J44.1         Discussion:  This is a 77 year old gentleman previous 50+ pack year history of smoking.  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  Otherwise is not buying them.  Had a recent exacerbation with chest tightness cough and sputum production was treated with antibiotics and steroids.  He improved.  Unfortunately now seems to be reexacerbating.  Plan: I would give him a injection of Depo-Medrol today. Start him on a prednisone taper. Treat with doxycycline x 7 days. He is on call and let us know if anything changes or gets worse. If he has increased shortness of breath or this is not improving in the next 24 to 48  hours he may need hospitalization. Patient is understanding of this. If he gets worse or calls back to the clinic please direct him to the emergency department. That he must stop smoking.  At this point there is very little choice for him in this matter. He should continue his Breztri inhaler Continue albuterol as needed. Orders for DME supplies for nebulizer machine and nebulized albuterol.  RTC 8 weeks with APP.    Current Outpatient Medications:    acetaminophen (TYLENOL) 500 MG tablet, Take 1,000 mg by mouth every 6 (six) hours as needed for headache (pain)., Disp: , Rfl:    albuterol (VENTOLIN HFA) 108 (90 Base) MCG/ACT inhaler, Inhale 1-2 puffs into the lungs every 6 (six) hours as needed for wheezing or shortness of breath., Disp: 1 each, Rfl: 11   azelastine (ASTELIN) 0.1 % nasal spray, Place 2 sprays into both nostrils 2 (two) times  daily. Use in each nostril as directed, Disp: 30 mL, Rfl: 2   azithromycin (ZITHROMAX) 250 MG tablet, Take two tablets today, 1 tablet daily till gone, Disp: 6 tablet, Rfl: 0   benzonatate (TESSALON) 200 MG capsule, Take 1 capsule (200 mg total) by mouth 3 (three) times daily as needed for cough., Disp: 30 capsule, Rfl: 1   betamethasone dipropionate 0.05 % lotion, Apply 1 application topically daily as needed (psoriasis/eczema)., Disp: , Rfl:    Budeson-Glycopyrrol-Formoterol (BREZTRI AEROSPHERE) 160-9-4.8 MCG/ACT AERO, Inhale 2 puffs into the lungs in the morning and at bedtime., Disp: 10.7 g, Rfl: 11   cholecalciferol (VITAMIN D) 25 MCG (1000 UNIT) tablet, Take 1,000 Units by mouth every morning., Disp: , Rfl:    clotrimazole (MYCELEX) 10 MG troche, Take 1 tablet (10 mg total) by mouth 5 (five) times daily., Disp: 35 Troche, Rfl: 0   doxycycline (VIBRA-TABS) 100 MG tablet, Take 1 tablet (100 mg total) by mouth 2 (two) times daily., Disp: 14 tablet, Rfl: 0   ferrous sulfate 325 (65 FE) MG EC tablet, Take 325 mg by mouth 3 (three) times daily with meals.,  Disp: , Rfl:    FLUoxetine (PROZAC) 40 MG capsule, Take 40 mg by mouth every morning., Disp: , Rfl:    fluticasone (FLONASE) 50 MCG/ACT nasal spray, Place 1 spray into both nostrils daily., Disp: 16 g, Rfl: 2   folic acid (FOLVITE) 1 MG tablet, Take 1 mg by mouth every morning., Disp: , Rfl:    Glycopyrrolate-Formoterol (BEVESPI AEROSPHERE) 9-4.8 MCG/ACT AERO, Inhale 2 puffs into the lungs 2 (two) times daily., Disp: 10.7 g, Rfl: 11   hydrocortisone 2.5 % cream, Apply 1 application topically 2 (two) times daily as needed (psoriasis/eczema)., Disp: , Rfl:    levothyroxine (SYNTHROID) 150 MCG tablet, Take 150 mcg by mouth daily before breakfast., Disp: , Rfl:    loratadine (CLARITIN) 10 MG tablet, Take 1 tablet (10 mg total) by mouth daily., Disp: 30 tablet, Rfl: 5   losartan (COZAAR) 25 MG tablet, Take 25 mg by mouth daily., Disp: , Rfl:    magnesium oxide (MAG-OX) 400 (240 Mg) MG tablet, Take 400 mg by mouth daily., Disp: , Rfl:    methotrexate (RHEUMATREX) 2.5 MG tablet, Take 6 tablets (15 mg total) by mouth every Sunday. (Patient taking differently: Take 15 mg by mouth every Sunday. 5 tabs every Sunday), Disp: 4 tablet, Rfl: 0   montelukast (SINGULAIR) 10 MG tablet, Take 10 mg by mouth every morning., Disp: , Rfl:    predniSONE (DELTASONE) 10 MG tablet, Take 4 tabs for 2 days, then 3 tabs for 2 days, 2 tabs for 2 days, then 1 tab for 2 days, then stop., Disp: 20 tablet, Rfl: 0   predniSONE (DELTASONE) 10 MG tablet, 4 x 3 days, 3 x 3 days, 2 x 3 days, 1 x 3 days, then stop, Disp: 30 tablet, Rfl: 0   predniSONE (DELTASONE) 10 MG tablet, Take 4 tabs by mouth once daily x4 days, then 3 tabs x4 days, 2 tabs x4 days, 1 tab x4 days and stop., Disp: 40 tablet, Rfl: 0   simvastatin (ZOCOR) 40 MG tablet, Take 40 mg by mouth every morning., Disp: , Rfl:    Spacer/Aero-Holding Chambers DEVI, 2 puffs by Does not apply route 2 (two) times daily. Use with inhaler twice daily, Disp: 1 each, Rfl: 0   vitamin B-12  (CYANOCOBALAMIN) 1000 MCG tablet, Take 1,000 mcg by mouth every morning., Disp: , Rfl:  Josephine Igo, DO [facility] 04/02/2023 10:49 AM

## 2023-04-03 ENCOUNTER — Telehealth: Payer: Self-pay | Admitting: Pulmonary Disease

## 2023-04-03 NOTE — Telephone Encounter (Signed)
Patient was in office yesterday 04/02/23 and received medication from Pharmacy but no nebulizer to use the medication. Please call and advise [phone].

## 2023-04-03 NOTE — Telephone Encounter (Signed)
Nebulizer referral needs to be faxed to [phone] [facility]

## 2023-04-03 NOTE — Telephone Encounter (Signed)
Advised Pt that order was sent to [facility] as of 04/02/23 and needs to be processed.

## 2023-05-02 ENCOUNTER — Encounter: Payer: Self-pay | Admitting: Podiatry

## 2023-05-02 ENCOUNTER — Ambulatory Visit: Payer: Medicare HMO | Admitting: Podiatry

## 2023-05-02 VITALS — BP 116/62 | HR 75 | Temp 97.9°F | Resp 18 | Ht 67.0 in | Wt 145.0 lb

## 2023-05-02 DIAGNOSIS — M79675 Pain in left toe(s): Secondary | ICD-10-CM | POA: Diagnosis not present

## 2023-05-02 DIAGNOSIS — B351 Tinea unguium: Secondary | ICD-10-CM | POA: Diagnosis not present

## 2023-05-02 DIAGNOSIS — M79674 Pain in right toe(s): Secondary | ICD-10-CM

## 2023-05-04 NOTE — Progress Notes (Signed)
Subjective:   Patient ID: Harry Lucas, male   DOB: 77 y.o.   MRN: [id number]   HPI Patient presents with severely elongated nailbeds 1-5 both feet that are very thick and he cannot take care of them and he wants them taken care of.  Patient does not smoke tries to be active   Review of Systems  All other systems reviewed and are negative.       Objective:  Physical Exam Vitals and nursing note reviewed.  Constitutional:      Appearance: He is well-developed.  Pulmonary:     Effort: Pulmonary effort is normal.  Musculoskeletal:        General: Normal range of motion.  Skin:    General: Skin is warm.  Neurological:     Mental Status: He is alert.     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

## 2023-05-28 ENCOUNTER — Ambulatory Visit: Payer: Medicare HMO | Admitting: Adult Health

## 2023-05-28 ENCOUNTER — Encounter: Payer: Self-pay | Admitting: Adult Health

## 2023-05-28 VITALS — BP 102/60 | HR 85 | Temp 98.0°F | Ht 67.0 in | Wt 155.2 lb

## 2023-05-28 DIAGNOSIS — R911 Solitary pulmonary nodule: Secondary | ICD-10-CM

## 2023-05-28 DIAGNOSIS — J310 Chronic rhinitis: Secondary | ICD-10-CM | POA: Diagnosis not present

## 2023-05-28 DIAGNOSIS — J449 Chronic obstructive pulmonary disease, unspecified: Secondary | ICD-10-CM | POA: Diagnosis not present

## 2023-05-28 DIAGNOSIS — Z23 Encounter for immunization: Secondary | ICD-10-CM | POA: Diagnosis not present

## 2023-05-28 NOTE — Patient Instructions (Addendum)
Continue on Breztri 2 puffs Twice daily  with spacer, rinse after use.  Albuterol inhaler As needed   High protein diet .  Activity as tolerated.  CT chest as planned in January.  Follow up with Dr. [name] in 4-6 months with PFTs and As needed

## 2023-05-28 NOTE — Assessment & Plan Note (Addendum)
Severe COPD with recent exacerbation now resolved.  Patient is to continue on maintenance regimen.  Congratulated on smoking cessation Flu shot today.  Continue with the lung cancer CT screening program with planned CT chest in January Check PFTs on return Plan Patient Instructions  Continue on Breztri 2 puffs Twice daily  with spacer, rinse after use.  Albuterol inhaler As needed   High protein diet .  Activity as tolerated.  CT chest as planned in January.  Follow up with Dr. [name] in 4-6 months with PFTs and As needed

## 2023-05-28 NOTE — Assessment & Plan Note (Signed)
Continue on Singulair daily.  Claritin as needed

## 2023-05-28 NOTE — Assessment & Plan Note (Signed)
Scattered lung nodules stable on most recent CT chest January 2024.  Some new areas of right middle lobe and lingular nodularity suspect postinflammatory process.  Will follow-up surveillance CT chest in January as planned

## 2023-05-28 NOTE — Progress Notes (Signed)
@Patient  ID: Harry Lucas, male    DOB: [birth year], 77 y.o.   MRN: [id number]  Chief Complaint  Patient presents with   Follow-up    Referring provider: Adrian Prince, MD  HPI: 77 year old male smoker followed for COPD and lung nodules Participate in the lung cancer CT chest screening program  TEST/EVENTS :  CT chest August 19, 2022 mild emphysema, small clustered small pulmonary nodules in the right middle lobe and lingula, scattered pulmonary nodules  11/19/2017 PFTs: FVC 3.09 (81%), FEV1 1.23 (45%), ratio 40. Severe obstructive airways disease with significant bronchodilator response   05/28/2023 Follow up : COPD and Lung nodules  Patient presents for a 76-month follow-up.  Patient was seen last visit for slow to resolve COPD exacerbation with ongoing cough and congestion.  Patient quit smoking since last visit.   He remains on Breztri inhaler twice daily. He was treated with doxycycline and a prednisone taper.  Since last visit patient is feeling so much better . Cough and congestion are decreased . Remains active, plays golf twice weekly. Volunteers at golf course weekly.  Wife has dementia, he is her caregiver.  Remains on Singulair daily.  Claritin as needed.  Says overall allergies are doing well.  Does have some occasional postnasal drainage .  Patient to space in the lung cancer CT chest screening program.  Last CT was January 2024 with lung RADS 2 benign appearance.  Stable pulmonary nodules.  There was some new area of clustered pulmonary nodules in the right middle lobe and lingula.  Patient has upcoming CT chest in January.    No Known Allergies  Immunization History  Administered Date(s) Administered   Fluad Quad(high Dose 65+) 09/25/2022   Fluad Trivalent(High Dose 65+) 05/28/2023   Influenza, Quadrivalent, Recombinant, Inj, Pf 05/21/2019, 06/28/2021   Influenza-Unspecified 05/07/2017, 06/26/2018   PFIZER(Purple Top)SARS-COV-2 Vaccination 10/08/2019, 11/02/2019     Past Medical History:  Diagnosis Date   Coronary artery disease    Eczema     Tobacco History: Social History   Tobacco Use  Smoking Status Former   Current packs/day: 0.00   Types: Cigarettes   Quit date: 07/13/2019   Years since quitting: 3.8   Passive exposure: Past  Smokeless Tobacco Never   Counseling given: Not Answered   Outpatient Medications Prior to Visit  Medication Sig Dispense Refill   acetaminophen (TYLENOL) 500 MG tablet Take 1,000 mg by mouth every 6 (six) hours as needed for headache (pain).     albuterol (VENTOLIN HFA) 108 (90 Base) MCG/ACT inhaler Inhale 1-2 puffs into the lungs every 6 (six) hours as needed for wheezing or shortness of breath. 1 each 11   betamethasone dipropionate 0.05 % lotion Apply 1 application topically daily as needed (psoriasis/eczema).     Budeson-Glycopyrrol-Formoterol (BREZTRI AEROSPHERE) 160-9-4.8 MCG/ACT AERO Inhale 2 puffs into the lungs in the morning and at bedtime. 10.7 g 11   cholecalciferol (VITAMIN D) 25 MCG (1000 UNIT) tablet Take 1,000 Units by mouth every morning.     FLUoxetine (PROZAC) 40 MG capsule Take 40 mg by mouth every morning.     fluticasone (FLONASE) 50 MCG/ACT nasal spray Place 1 spray into both nostrils daily. 16 g 2   folic acid (FOLVITE) 1 MG tablet Take 1 mg by mouth every morning.     Glycopyrrolate-Formoterol (BEVESPI AEROSPHERE) 9-4.8 MCG/ACT AERO Inhale 2 puffs into the lungs 2 (two) times daily. 10.7 g 11   hydrocortisone 2.5 % cream Apply 1 application topically 2 (two) times  daily as needed (psoriasis/eczema).     levothyroxine (SYNTHROID) 150 MCG tablet Take 150 mcg by mouth daily before breakfast.     loratadine (CLARITIN) 10 MG tablet Take 1 tablet (10 mg total) by mouth daily. 30 tablet 5   losartan (COZAAR) 25 MG tablet Take 25 mg by mouth daily.     magnesium oxide (MAG-OX) 400 (240 Mg) MG tablet Take 400 mg by mouth daily.     methotrexate (RHEUMATREX) 2.5 MG tablet Take 6 tablets (15 mg  total) by mouth every Sunday. (Patient taking differently: Take 15 mg by mouth every Sunday. 5 tabs every Sunday) 4 tablet 0   montelukast (SINGULAIR) 10 MG tablet Take 10 mg by mouth every morning.     simvastatin (ZOCOR) 40 MG tablet Take 40 mg by mouth every morning.     Spacer/Aero-Holding Chambers DEVI 2 puffs by Does not apply route 2 (two) times daily. Use with inhaler twice daily 1 each 0   vitamin B-12 (CYANOCOBALAMIN) 1000 MCG tablet Take 1,000 mcg by mouth every morning.     albuterol (PROVENTIL) (2.5 MG/3ML) 0.083% nebulizer solution Take 3 mLs (2.5 mg total) by nebulization every 6 (six) hours as needed for wheezing or shortness of breath. (Patient not taking: Reported on 05/28/2023) 75 mL 12   azelastine (ASTELIN) 0.1 % nasal spray Place 2 sprays into both nostrils 2 (two) times daily. Use in each nostril as directed (Patient not taking: Reported on 05/28/2023) 30 mL 2   azithromycin (ZITHROMAX) 250 MG tablet Take two tablets today, 1 tablet daily till gone (Patient not taking: Reported on 05/28/2023) 6 tablet 0   benzonatate (TESSALON) 200 MG capsule Take 1 capsule (200 mg total) by mouth 3 (three) times daily as needed for cough. (Patient not taking: Reported on 05/28/2023) 30 capsule 1   clotrimazole (MYCELEX) 10 MG troche Take 1 tablet (10 mg total) by mouth 5 (five) times daily. (Patient not taking: Reported on 05/28/2023) 35 Troche 0   doxycycline (VIBRA-TABS) 100 MG tablet Take 1 tablet (100 mg total) by mouth 2 (two) times daily. (Patient not taking: Reported on 05/28/2023) 14 tablet 0   ferrous sulfate 325 (65 FE) MG EC tablet Take 325 mg by mouth 3 (three) times daily with meals. (Patient not taking: Reported on 05/28/2023)     predniSONE (DELTASONE) 10 MG tablet Take 4 tabs for 2 days, then 3 tabs for 2 days, 2 tabs for 2 days, then 1 tab for 2 days, then stop. (Patient not taking: Reported on 05/28/2023) 20 tablet 0   predniSONE (DELTASONE) 10 MG tablet 4 x 3 days, 3 x 3 days,  2 x 3 days, 1 x 3 days, then stop (Patient not taking: Reported on 05/28/2023) 30 tablet 0   predniSONE (DELTASONE) 10 MG tablet Take 4 tabs by mouth once daily x4 days, then 3 tabs x4 days, 2 tabs x4 days, 1 tab x4 days and stop. (Patient not taking: Reported on 05/28/2023) 40 tablet 0   No facility-administered medications prior to visit.     Review of Systems:   Constitutional:   No  weight loss, night sweats,  Fevers, chills, +fatigue, or  lassitude.  HEENT:   No headaches,  Difficulty swallowing,  Tooth/dental problems, or  Sore throat,                No sneezing, itching, ear ache,  +nasal congestion, post nasal drip,   CV:  No chest pain,  Orthopnea, PND, swelling in lower  extremities, anasarca, dizziness, palpitations, syncope.   GI  No heartburn, indigestion, abdominal pain, nausea, vomiting, diarrhea, change in bowel habits, loss of appetite, bloody stools.   Resp:   No chest wall deformity  Skin: no rash or lesions.  GU: no dysuria, change in color of urine, no urgency or frequency.  No flank pain, no hematuria   MS:  No joint pain or swelling.  No decreased range of motion.  No back pain.    Physical Exam  BP 102/60 (BP Location: Left Arm, Patient Position: Sitting, Cuff Size: Normal)   Pulse 85   Temp 98 F (36.7 C) (Oral)   Ht 5\' 7"  (1.702 m)   Wt 155 lb 3.2 oz (70.4 kg)   SpO2 97%   BMI 24.31 kg/m   GEN: A/Ox3; pleasant , NAD, well nourished    HEENT:  [place]/AT,   NOSE-clear, THROAT-clear, no lesions, no postnasal drip or exudate noted.   NECK:  Supple w/ fair ROM; no JVD; normal carotid impulses w/o bruits; no thyromegaly or nodules palpated; no lymphadenopathy.    RESP  Clear  P & A; w/o, wheezes/ rales/ or rhonchi. no accessory muscle use, no dullness to percussion  CARD:  RRR, no m/r/g, no peripheral edema, pulses intact, no cyanosis or clubbing.  GI:   Soft & nt; nml bowel sounds; no organomegaly or masses detected.   Musco: Warm bil, no  deformities or joint swelling noted.   Neuro: alert, no focal deficits noted.    Skin: Warm, no lesions or rashes    Lab Results:  CBC   BNP   ProBNP No results found for: "PROBNP"  Imaging: No results found.  Administration History     None          Latest Ref Rng & Units 11/19/2017   11:24 AM  PFT Results  FVC-Pre L 2.16   FVC-Predicted Pre % 57   FVC-Post L 3.09   FVC-Predicted Post % 81   Pre FEV1/FVC % % 43   Post FEV1/FCV % % 40   FEV1-Pre L 0.93   FEV1-Predicted Pre % 34   FEV1-Post L 1.23   DLCO uncorrected ml/min/mmHg 15.25   DLCO UNC% % 53   DLVA Predicted % 82   TLC L 7.55   TLC % Predicted % 117   RV % Predicted % 221     No results found for: "NITRICOXIDE"      Assessment & Plan:   COPD (chronic obstructive pulmonary disease) (HCC) Severe COPD with recent exacerbation now resolved.  Patient is to continue on maintenance regimen.  Congratulated on smoking cessation Flu shot today.  Continue with the lung cancer CT screening program with planned CT chest in January Check PFTs on return Plan Patient Instructions  Continue on Breztri 2 puffs Twice daily  with spacer, rinse after use.  Albuterol inhaler As needed   High protein diet .  Activity as tolerated.  CT chest as planned in January.  Follow up with Dr. [name] in 4-6 months with PFTs and As needed       Lung nodule Scattered lung nodules stable on most recent CT chest January 2024.  Some new areas of right middle lobe and lingular nodularity suspect postinflammatory process.  Will follow-up surveillance CT chest in January as planned  Rhinitis Continue on Singulair daily.  Claritin as needed     Rubye Oaks, NP 05/28/2023

## 2023-05-29 DIAGNOSIS — L400 Psoriasis vulgaris: Secondary | ICD-10-CM | POA: Diagnosis not present

## 2023-06-16 ENCOUNTER — Other Ambulatory Visit: Payer: Self-pay | Admitting: Pulmonary Disease

## 2023-06-17 ENCOUNTER — Ambulatory Visit: Payer: Medicare HMO | Admitting: Podiatry

## 2023-06-17 DIAGNOSIS — S86219A Strain of muscle(s) and tendon(s) of anterior muscle group at lower leg level, unspecified leg, initial encounter: Secondary | ICD-10-CM

## 2023-06-17 NOTE — Patient Instructions (Signed)
Call [facility] to schedule your ultrasound at the below locations.  Please allow at least 1 business day after your visit to process the referral.  It may take longer depending on approval from insurance.  Please let me know if you have issues or problems scheduling the ultrasound   [facility] [place], [place] [address]  [place] [address] [place], [place] [address]

## 2023-06-20 ENCOUNTER — Encounter: Payer: Self-pay | Admitting: Podiatry

## 2023-06-20 NOTE — Progress Notes (Signed)
  Subjective:  Patient ID: Harry Lucas, male    DOB: [birth year],  MRN: [id number]  Chief Complaint  Patient presents with   Gait Issue    Bilateral flat feet. Left one is worse. Used to wear orthotics 30+ years ago.     77 y.o. male presents with the above complaint. History confirmed with patient.  He has been having difficulty walking he notes that he feels like the left foot is slapping the ground  Objective:  Physical Exam: warm, good capillary refill, no trophic changes or ulcerative lesions, normal DP and PT pulses, normal sensory exam, and 5 out of 5 strength on the right he has 4 out of 5 strength in dorsiflexion on the left, no notable discontinuity of the tibialis anterior tendon to manipulation.  Dorsiflexion of the foot great significant abduction compared to the right   Assessment:   1. Rupture of tibialis anterior tendon      Plan:  Patient was evaluated and treated and all questions answered.  Appears to be developing foot drop, with his dorsiflexion action he develops quite a bit of inversion as opposed to dorsiflexing in a rectus position in the frontal plane like he does on the contralateral limb.  He seems to have either weakness or possible tearing of the tibialis anterior tendon.  I recommended ultrasound to evaluate for possible tear, we discussed that if there is a tear surgical nonsurgical treatment options exist for both.  If the tendon is completely intact may need EMG to evaluate for any muscular weakness and neurologic dysfunction.  I will see her back after the ultrasound to review treatment options.  Return for after ultrasound to review.

## 2023-06-26 DIAGNOSIS — E785 Hyperlipidemia, unspecified: Secondary | ICD-10-CM | POA: Diagnosis not present

## 2023-06-26 DIAGNOSIS — I129 Hypertensive chronic kidney disease with stage 1 through stage 4 chronic kidney disease, or unspecified chronic kidney disease: Secondary | ICD-10-CM | POA: Diagnosis not present

## 2023-06-26 DIAGNOSIS — I2510 Atherosclerotic heart disease of native coronary artery without angina pectoris: Secondary | ICD-10-CM | POA: Diagnosis not present

## 2023-06-26 DIAGNOSIS — N1831 Chronic kidney disease, stage 3a: Secondary | ICD-10-CM | POA: Diagnosis not present

## 2023-06-26 DIAGNOSIS — E039 Hypothyroidism, unspecified: Secondary | ICD-10-CM | POA: Diagnosis not present

## 2023-07-04 DIAGNOSIS — I739 Peripheral vascular disease, unspecified: Secondary | ICD-10-CM | POA: Diagnosis not present

## 2023-07-04 DIAGNOSIS — I714 Abdominal aortic aneurysm, without rupture, unspecified: Secondary | ICD-10-CM | POA: Diagnosis not present

## 2023-07-04 DIAGNOSIS — N401 Enlarged prostate with lower urinary tract symptoms: Secondary | ICD-10-CM | POA: Diagnosis not present

## 2023-07-04 DIAGNOSIS — I129 Hypertensive chronic kidney disease with stage 1 through stage 4 chronic kidney disease, or unspecified chronic kidney disease: Secondary | ICD-10-CM | POA: Diagnosis not present

## 2023-07-04 DIAGNOSIS — I700 Atherosclerosis of aorta: Secondary | ICD-10-CM | POA: Diagnosis not present

## 2023-07-04 DIAGNOSIS — F331 Major depressive disorder, recurrent, moderate: Secondary | ICD-10-CM | POA: Diagnosis not present

## 2023-07-04 DIAGNOSIS — Z0000 Encounter for general adult medical examination without abnormal findings: Secondary | ICD-10-CM | POA: Diagnosis not present

## 2023-07-04 DIAGNOSIS — I10 Essential (primary) hypertension: Secondary | ICD-10-CM | POA: Diagnosis not present

## 2023-07-04 DIAGNOSIS — I2510 Atherosclerotic heart disease of native coronary artery without angina pectoris: Secondary | ICD-10-CM | POA: Diagnosis not present

## 2023-07-04 DIAGNOSIS — M810 Age-related osteoporosis without current pathological fracture: Secondary | ICD-10-CM | POA: Diagnosis not present

## 2023-07-04 DIAGNOSIS — J449 Chronic obstructive pulmonary disease, unspecified: Secondary | ICD-10-CM | POA: Diagnosis not present

## 2023-07-04 DIAGNOSIS — R911 Solitary pulmonary nodule: Secondary | ICD-10-CM | POA: Diagnosis not present

## 2023-07-04 DIAGNOSIS — N1831 Chronic kidney disease, stage 3a: Secondary | ICD-10-CM | POA: Diagnosis not present

## 2023-07-22 ENCOUNTER — Ambulatory Visit
Admission: RE | Admit: 2023-07-22 | Discharge: 2023-07-22 | Disposition: A | Discharge status: Home / Self Care | Payer: Medicare HMO | Source: Ambulatory Visit | Attending: Podiatry | Admitting: Podiatry

## 2023-07-22 ENCOUNTER — Other Ambulatory Visit: Payer: Medicare HMO

## 2023-07-22 DIAGNOSIS — S86219A Strain of muscle(s) and tendon(s) of anterior muscle group at lower leg level, unspecified leg, initial encounter: Secondary | ICD-10-CM

## 2023-07-22 DIAGNOSIS — M21372 Foot drop, left foot: Secondary | ICD-10-CM | POA: Diagnosis not present

## 2023-07-28 ENCOUNTER — Other Ambulatory Visit: Payer: Self-pay | Admitting: Podiatry

## 2023-07-28 ENCOUNTER — Encounter: Payer: Self-pay | Admitting: Neurology

## 2023-07-28 DIAGNOSIS — M21372 Foot drop, left foot: Secondary | ICD-10-CM

## 2023-07-29 ENCOUNTER — Telehealth: Payer: Self-pay

## 2023-07-29 NOTE — Telephone Encounter (Signed)
-----   Message from [facility] Evie J sent at 07/28/2023  4:58 PM EST -----  ----- Message ----- From: Edwin Cap, DPM Sent: 07/28/2023   8:15 AM EST To: Elveria Royals, CMA  Can you refer him to [facility] neuro for foot drop and EMG/NCV?

## 2023-07-29 NOTE — Telephone Encounter (Signed)
Referral, demographics, U/S report and office note faxed to [facility] Phone [phone] Fax [phone]  Confirmation received

## 2023-07-30 ENCOUNTER — Encounter: Payer: Self-pay | Admitting: Podiatry

## 2023-08-18 ENCOUNTER — Ambulatory Visit (HOSPITAL_BASED_OUTPATIENT_CLINIC_OR_DEPARTMENT_OTHER)
Admission: RE | Admit: 2023-08-18 | Discharge: 2023-08-18 | Disposition: A | Discharge status: Home / Self Care | Payer: Medicare HMO | Source: Ambulatory Visit | Attending: Pulmonary Disease | Admitting: Pulmonary Disease

## 2023-08-18 DIAGNOSIS — R911 Solitary pulmonary nodule: Secondary | ICD-10-CM | POA: Diagnosis not present

## 2023-08-18 DIAGNOSIS — J432 Centrilobular emphysema: Secondary | ICD-10-CM | POA: Diagnosis not present

## 2023-08-18 DIAGNOSIS — I700 Atherosclerosis of aorta: Secondary | ICD-10-CM | POA: Diagnosis not present

## 2023-08-18 DIAGNOSIS — R918 Other nonspecific abnormal finding of lung field: Secondary | ICD-10-CM | POA: Diagnosis not present
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
## 2023-08-28 ENCOUNTER — Encounter: Payer: Self-pay | Admitting: Neurology

## 2023-08-28 ENCOUNTER — Ambulatory Visit: Payer: Medicare HMO | Admitting: Neurology

## 2023-08-28 VITALS — BP 127/79 | HR 88 | Ht 67.0 in | Wt 168.0 lb

## 2023-08-28 DIAGNOSIS — R2689 Other abnormalities of gait and mobility: Secondary | ICD-10-CM | POA: Diagnosis not present

## 2023-08-28 DIAGNOSIS — R269 Unspecified abnormalities of gait and mobility: Secondary | ICD-10-CM

## 2023-08-28 DIAGNOSIS — R253 Fasciculation: Secondary | ICD-10-CM

## 2023-08-28 DIAGNOSIS — Z131 Encounter for screening for diabetes mellitus: Secondary | ICD-10-CM | POA: Diagnosis not present

## 2023-08-28 DIAGNOSIS — M21372 Foot drop, left foot: Secondary | ICD-10-CM | POA: Diagnosis not present

## 2023-08-28 DIAGNOSIS — M21371 Foot drop, right foot: Secondary | ICD-10-CM | POA: Diagnosis not present

## 2023-08-28 DIAGNOSIS — R209 Unspecified disturbances of skin sensation: Secondary | ICD-10-CM | POA: Diagnosis not present

## 2023-08-28 NOTE — Patient Instructions (Signed)
I saw you today for weakness of legs, balance and walking problems. I think this is a nerve problem, but the exact cause I'm not sure right now.  I would like to investigate further with the following: -Blood work today -Muscle and nerve test called an EMG (see more information below). I will schedule this for you Monday, 09/01/23 at 11 am.  We will meet again after the EMG, on 09/04/23 at 3 pm to discuss the results and next steps.  Please let me know if you have any questions or concerns in the meantime.  The physicians and staff at [facility] are committed to providing excellent care. You may receive a survey requesting feedback about your experience at our office. We strive to receive "very good" responses to the survey questions. If you feel that your experience would prevent you from giving the office a "very good " response, please contact our office to try to remedy the situation. We may be reached at [phone]. Thank you for taking the time out of your busy day to complete the survey.  Jacquelyne Balint, MD [facility]  ELECTROMYOGRAM AND NERVE CONDUCTION STUDIES (EMG/NCS) INSTRUCTIONS  How to Prepare The neurologist conducting the EMG will need to know if you have certain medical conditions. Tell the neurologist and other EMG lab personnel if you: Have a pacemaker or any other electrical medical device Take blood-thinning medications Have hemophilia, a blood-clotting disorder that causes prolonged bleeding Bathing Take a shower or bath shortly before your exam in order to remove oils from your skin. Don't apply lotions or creams before the exam.  What to Expect You'll likely be asked to change into a hospital gown for the procedure and lie down on an examination table. The following explanations can help you understand what will happen during the exam.  Electrodes. The neurologist or a technician places surface electrodes at various locations on your skin depending on  where you're experiencing symptoms. Or the neurologist may insert needle electrodes at different sites depending on your symptoms.  Sensations. The electrodes will at times transmit a tiny electrical current that you may feel as a twinge or spasm. The needle electrode may cause discomfort or pain that usually ends shortly after the needle is removed. If you are concerned about discomfort or pain, you may want to talk to the neurologist about taking a short break during the exam.  Instructions. During the needle EMG, the neurologist will assess whether there is any spontaneous electrical activity when the muscle is at rest - activity that isn't present in healthy muscle tissue - and the degree of activity when you slightly contract the muscle.  He or she will give you instructions on resting and contracting a muscle at appropriate times. Depending on what muscles and nerves the neurologist is examining, he or she may ask you to change positions during the exam.  After your EMG You may experience some temporary, minor bruising where the needle electrode was inserted into your muscle. This bruising should fade within several days. If it persists, contact your primary care doctor.    Preventing Falls at [facility] are common, often dreaded events in the lives of older people. Aside from the obvious injuries and even death that may result, fall can cause wide-ranging consequences including loss of independence, mental decline, decreased activity and mobility. Younger people are also at risk of falling, especially those with chronic illnesses and fatigue.  Ways to reduce risk for falling Examine diet and  medications. Warm foods and alcohol dilate blood vessels, which can lead to dizziness when standing. Sleep aids, antidepressants and pain medications can also increase the likelihood of a fall.  Get a vision exam. Poor vision, cataracts and glaucoma increase the chances of falling.  Check foot gear.  Shoes should fit snugly and have a sturdy, nonskid sole and a broad, low heel  Participate in a physician-approved exercise program to build and maintain muscle strength and improve balance and coordination. Programs that use ankle weights or stretch bands are excellent for muscle-strengthening. Water aerobics programs and low-impact Tai Chi programs have also been shown to improve balance and coordination.  Increase vitamin D intake. Vitamin D improves muscle strength and increases the amount of calcium the body is able to absorb and deposit in bones.  How to prevent falls from common hazards Floors - Remove all loose wires, cords, and throw rugs. Minimize clutter. Make sure rugs are anchored and smooth. Keep furniture in its usual place.  Chairs -- Use chairs with straight backs, armrests and firm seats. Add firm cushions to existing pieces to add height.  Bathroom - Install grab bars and non-skid tape in the tub or shower. Use a bathtub transfer bench or a shower chair with a back support Use an elevated toilet seat and/or safety rails to assist standing from a low surface. Do not use towel racks or bathroom tissue holders to help you stand.  Lighting - Make sure halls, stairways, and entrances are well-lit. Install a night light in your bathroom or hallway. Make sure there is a light switch at the top and bottom of the staircase. Turn lights on if you get up in the middle of the night. Make sure lamps or light switches are within reach of the bed if you have to get up during the night.  Kitchen - Install non-skid rubber mats near the sink and stove. Clean spills immediately. Store frequently used utensils, pots, pans between waist and eye level. This helps prevent reaching and bending. Sit when getting things out of lower cupboards.  Living room/ Bedrooms - Place furniture with wide spaces in between, giving enough room to move around. Establish a route through the living room that gives you  something to hold onto as you walk.  Stairs - Make sure treads, rails, and rugs are secure. Install a rail on both sides of the stairs. If stairs are a threat, it might be helpful to arrange most of your activities on the lower level to reduce the number of times you must climb the stairs.  Entrances and doorways - Install metal handles on the walls adjacent to the doorknobs of all doors to make it more secure as you travel through the doorway.  Tips for maintaining balance Keep at least one hand free at all times. Try using a backpack or fanny pack to hold things rather than carrying them in your hands. Never carry objects in both hands when walking as this interferes with keeping your balance.  Attempt to swing both arms from front to back while walking. This might require a conscious effort if Parkinson's disease has diminished your movement. It will, however, help you to maintain balance and posture, and reduce fatigue.  Consciously lift your feet off of the ground when walking. Shuffling and dragging of the feet is a common culprit in losing your balance.  When trying to navigate turns, use a "U" technique of facing forward and making a wide turn, rather than pivoting  sharply.  Try to stand with your feet shoulder-length apart. When your feet are close together for any length of time, you increase your risk of losing your balance and falling.  Do one thing at a time. Don't try to walk and accomplish another task, such as reading or looking around. The decrease in your automatic reflexes complicates motor function, so the less distraction, the better.  Do not wear rubber or gripping soled shoes, they might "catch" on the floor and cause tripping.  Move slowly when changing positions. Use deliberate, concentrated movements and, if needed, use a grab bar or walking aid. Count 15 seconds between each movement. For example, when rising from a seated position, wait 15 seconds after standing to  begin walking.  If balance is a continuous problem, you might want to consider a walking aid such as a cane, walking stick, or walker. Once you've mastered walking with help, you might be ready to try it on your own again.
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
## 2023-09-01 ENCOUNTER — Encounter: Payer: Self-pay | Admitting: Podiatry

## 2023-09-01 ENCOUNTER — Ambulatory Visit: Payer: Medicare HMO | Admitting: Podiatry

## 2023-09-01 ENCOUNTER — Ambulatory Visit: Payer: Medicare HMO | Admitting: Neurology

## 2023-09-01 DIAGNOSIS — M79675 Pain in left toe(s): Secondary | ICD-10-CM | POA: Diagnosis not present

## 2023-09-01 DIAGNOSIS — R269 Unspecified abnormalities of gait and mobility: Secondary | ICD-10-CM

## 2023-09-01 DIAGNOSIS — M79674 Pain in right toe(s): Secondary | ICD-10-CM | POA: Diagnosis not present

## 2023-09-01 DIAGNOSIS — R2689 Other abnormalities of gait and mobility: Secondary | ICD-10-CM

## 2023-09-01 DIAGNOSIS — M21372 Foot drop, left foot: Secondary | ICD-10-CM

## 2023-09-01 DIAGNOSIS — B351 Tinea unguium: Secondary | ICD-10-CM

## 2023-09-01 DIAGNOSIS — M5410 Radiculopathy, site unspecified: Secondary | ICD-10-CM

## 2023-09-01 DIAGNOSIS — G629 Polyneuropathy, unspecified: Secondary | ICD-10-CM

## 2023-09-01 DIAGNOSIS — M21371 Foot drop, right foot: Secondary | ICD-10-CM

## 2023-09-01 NOTE — Progress Notes (Signed)
This patient presents to the office with chief complaint of long thick painful nails.  Patient says the nails are painful walking and wearing shoes.  This patient is unable to self treat.  This patient is unable to trim his nails since she is unable to reach his nails.  he presents to the office for preventative foot care services.  General Appearance  Alert, conversant and in no acute stress.  Vascular  Dorsalis pedis and posterior tibial  pulses are not  palpable  bilaterally.  Capillary return is within normal limits  bilaterally. Temperature is within normal limits  bilaterally.  Neurologic  Senn-Weinstein monofilament wire test within normal limits  bilaterally. Muscle power within normal limits bilaterally.  Nails Thick disfigured discolored nails with subungual debris  from hallux to fifth toes bilaterally. No evidence of bacterial infection or drainage bilaterally.  Orthopedic  No limitations of motion  feet .  No crepitus or effusions noted.  No bony pathology or digital deformities noted.  Skin  normotropic skin with no porokeratosis noted bilaterally.  No signs of infections or ulcers noted.     Onychomycosis  Nails  B/L.  Pain in right toes  Pain in left toes  Debridement of nails both feet followed trimming the nails with dremel tool.    RTC 3 months.   Helane Gunther DPM

## 2023-09-01 NOTE — Procedures (Signed)
[facility]  [address] [place], [address]  [place], [place] [address] Tel: [phone] Fax: [phone] Test Date:  09/01/2023  Patient: Harry Lucas DOB: Nov 10, 1945 Physician: Jacquelyne Balint, MD  Sex: Male Height: 5\' 7"  Ref Phys: Jacquelyne Balint, MD  ID#: [id number]   Technician:    History: This is a 78 year old male with weakness of legs and hands.  NCV & EMG Findings: Extensive electrodiagnostic evaluation of the left lower extremity with additional nerve conduction studies of the left upper extremity was stopped early due to patient discomfort. The findings include: Left sural, superficial peroneal/fibular, median, ulnar, and radial sensory responses are absent. Left peroneal/fibular (EDB) and tibial (AH) motor responses are absent. Left peroneal/fibular (TA), median (APB), ulnar (ADM), and ulnar (FDI) motor responses show reduced amplitudes (TA 0.86, APB 3.7, ADM 4.2, FDI 5.2 mV). Left H reflex is absent. Chronic motor axon loss changes WITH active denervation changes are seen in the left tibialis anterior and medial head of gastrocnemius muscles. Chronic motor axon loss changes WITHOUT active denervation changes are seen in the left rectus femoris, adductor longus, short head of biceps femoris, and gluteus medius muscles. Fasciculations are seen in 2 of 7 lower extremity muscles tested (adductor longus and rectus femoris muscles). Lumbosacral paraspinal muscles were incompletely evaluated as patient asked to stop the test due to discomfort. Further testing of the left upper limb, cervical paraspinal, and thoracic paraspinals could not be completed due to patient intolerance of the needle examination.  Impression: This is an abnormal study. The testing was to be more extensive but stopped early due to patient discomfort. 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 Likely overlapping intraspinal canal lesions which could be as extensive as left L3-S1, consistent with polyradiculopathy, though #1 above makes this difficult to fully interpret. Study is incomplete for the evaluation of an evolving disorder of anterior horn cells (ie: motor neuron disease) due to patient halting the test early.    ___________________________ Jacquelyne Balint, MD    Nerve Conduction Studies Motor Nerve Results    Latency Amplitude F-Lat Segment Distance CV Comment  Site (ms) Norm (mV) Norm (ms)  (cm) (m/s) Norm   Left Fibular (EDB) Motor  Ankle *NR  < 6.0 *NR  > 2.5        Bel fib head *NR - *NR -  Bel fib head-Ankle - *NR  > 40   Pop fossa *NR - *NR -  Pop fossa-Bel fib head - *NR -   Left Fibular (TA) Motor  Fib head 2.1  < 4.5 *0.86  > 3.0        Pop fossa 4.3  < 6.7 0.31 -  Pop fossa-Fib head 9 41  > 40   Left Median (APB) Motor  Wrist 3.8  < 4.0 *3.7  > 5.0        Elbow 9.1 - 3.1 -  Elbow-Wrist 27 51  > 50   Left Tibial (AH) Motor  Ankle *NR  < 6.0 *NR  > 4.0        Knee *NR - *NR -  Knee-Ankle - *NR  > 40   Left Ulnar (ADM) Motor  Wrist 2.9  < 3.1 *4.2  > 7.0        Bel elbow 7.5 - 3.0 -  Bel elbow-Wrist 27 59  > 50   Ab elbow 9.5 - 3.1 -  Ab elbow-Bel elbow 10 50 -   Left Ulnar (FDI) Motor  Wrist 4.1  < 4.5 *5.2  > 7.0         Sensory Sites    Neg Peak Lat Amplitude (O-P) Segment Distance Velocity Comment  Site (ms) Norm (V) Norm  (cm) (ms)   Left Median Sensory  Wrist-Dig II *NR  < 3.8 *NR  > 10 Wrist-Dig II 13    Left Radial Sensory  Forearm-Wrist *NR  < 2.8 *NR  > 10 Forearm-Wrist 10    Left Superficial Fibular Sensory  14 cm-Ankle *NR  < 4.6 *NR  > 3 14 cm-Ankle 14    Left Sural Sensory  Calf-Lat mall *NR  < 4.6 *NR  > 3 Calf-Lat mall 14    Left Ulnar Sensory  Wrist-Dig V *NR  < 3.2 *NR  > 5 Wrist-Dig V 11     H-Reflex Results    M-Lat H Lat H Neg Amp H-M Lat  Site (ms) (ms) Norm (mV) (ms)  Left Tibial H-Reflex  Pop fossa  7.4 -  < 35.0 - -   Electromyography   Side Muscle Ins.Act Fibs Fasc Recrt Amp Dur Poly Activation Comment  Left Tib ant Nml *1+ Nml *SMU *1+ *1+ *1+ Nml N/A  Left Gastroc MH Nml *2+ Nml *SMU *1+ *1+ *1+ Nml N/A  Left Rectus fem Nml Nml *1+ *2- *1+ *1+ *1+ Nml N/A  Left Add longus Nml Nml *1+ *1- *1+ *1+ *1+ Nml N/A  Left Biceps fem SH Nml Nml Nml *2- *1+ *1+ *1+ Nml N/A  Left Gluteus med Nml Nml Nml *1- *1+ *1+ *1+ Nml N/A  Left L5 PSP Nml Nml Nml Nml Nml Nml Nml Nml Discomfort;; Pt asked to stop      Waveforms:  Motor               Sensory             H-Reflex

## 2023-09-02 LAB — HEMOGLOBIN A1C
Hgb A1c MFr Bld: 5.6 %{Hb} (ref <5.7)
Mean Plasma Glucose: 114 mg/dL
eAG (mmol/L): 6.3 mmol/L

## 2023-09-02 LAB — VITAMIN B1: Vitamin B1 (Thiamine): 19 nmol/L (ref 8–30)

## 2023-09-02 LAB — IMMUNOFIXATION ELECTROPHORESIS
IgG (Immunoglobin G), Serum: 1263 mg/dL (ref 600–1540)
IgM, Serum: 41 mg/dL — ABNORMAL LOW (ref 50–300)
Immunoglobulin A: 273 mg/dL (ref 70–320)

## 2023-09-02 LAB — VITAMIN B12: Vitamin B-12: 608 pg/mL (ref 200–1100)

## 2023-09-02 LAB — TSH: TSH: 13.56 m[IU]/L — ABNORMAL HIGH (ref 0.40–4.50)

## 2023-09-02 NOTE — Telephone Encounter (Signed)
Error

## 2023-09-04 ENCOUNTER — Ambulatory Visit: Payer: Medicare HMO | Admitting: Pulmonary Disease

## 2023-09-04 ENCOUNTER — Ambulatory Visit: Payer: Medicare HMO | Admitting: Neurology

## 2023-09-04 ENCOUNTER — Encounter: Payer: Self-pay | Admitting: Neurology

## 2023-09-04 ENCOUNTER — Other Ambulatory Visit: Payer: Medicare HMO

## 2023-09-04 VITALS — BP 123/73 | HR 63 | Ht 67.0 in | Wt 167.0 lb

## 2023-09-04 DIAGNOSIS — M21372 Foot drop, left foot: Secondary | ICD-10-CM | POA: Diagnosis not present

## 2023-09-04 DIAGNOSIS — M5410 Radiculopathy, site unspecified: Secondary | ICD-10-CM | POA: Diagnosis not present

## 2023-09-04 DIAGNOSIS — G629 Polyneuropathy, unspecified: Secondary | ICD-10-CM | POA: Diagnosis not present

## 2023-09-04 DIAGNOSIS — R269 Unspecified abnormalities of gait and mobility: Secondary | ICD-10-CM

## 2023-09-04 DIAGNOSIS — M21371 Foot drop, right foot: Secondary | ICD-10-CM

## 2023-09-04 DIAGNOSIS — R209 Unspecified disturbances of skin sensation: Secondary | ICD-10-CM

## 2023-09-04 DIAGNOSIS — R253 Fasciculation: Secondary | ICD-10-CM

## 2023-09-04 DIAGNOSIS — R2689 Other abnormalities of gait and mobility: Secondary | ICD-10-CM

## 2023-09-04 NOTE — Patient Instructions (Signed)
I want to do more blood work today.  I want to get an MRI of your low back (lumbar spine).  I will be in touch when I have your results.  I am referring you to physical therapy to help with your leg weakness, balance problems, and for recommendation on ankle foot orthosis to help with your foot drop.  I will see you back in 3 months or sooner if needed.  The physicians and staff at [facility] are committed to providing excellent care. You may receive a survey requesting feedback about your experience at our office. We strive to receive "very good" responses to the survey questions. If you feel that your experience would prevent you from giving the office a "very good " response, please contact our office to try to remedy the situation. We may be reached at [phone]. Thank you for taking the time out of your busy day to complete the survey.  Jacquelyne Balint, MD [facility]  Preventing Falls at [facility] are common, often dreaded events in the lives of older people. Aside from the obvious injuries and even death that may result, fall can cause wide-ranging consequences including loss of independence, mental decline, decreased activity and mobility. Younger people are also at risk of falling, especially those with chronic illnesses and fatigue.  Ways to reduce risk for falling Examine diet and medications. Warm foods and alcohol dilate blood vessels, which can lead to dizziness when standing. Sleep aids, antidepressants and pain medications can also increase the likelihood of a fall.  Get a vision exam. Poor vision, cataracts and glaucoma increase the chances of falling.  Check foot gear. Shoes should fit snugly and have a sturdy, nonskid sole and a broad, low heel  Participate in a physician-approved exercise program to build and maintain muscle strength and improve balance and coordination. Programs that use ankle weights or stretch bands are excellent for  muscle-strengthening. Water aerobics programs and low-impact Tai Chi programs have also been shown to improve balance and coordination.  Increase vitamin D intake. Vitamin D improves muscle strength and increases the amount of calcium the body is able to absorb and deposit in bones.  How to prevent falls from common hazards Floors - Remove all loose wires, cords, and throw rugs. Minimize clutter. Make sure rugs are anchored and smooth. Keep furniture in its usual place.  Chairs -- Use chairs with straight backs, armrests and firm seats. Add firm cushions to existing pieces to add height.  Bathroom - Install grab bars and non-skid tape in the tub or shower. Use a bathtub transfer bench or a shower chair with a back support Use an elevated toilet seat and/or safety rails to assist standing from a low surface. Do not use towel racks or bathroom tissue holders to help you stand.  Lighting - Make sure halls, stairways, and entrances are well-lit. Install a night light in your bathroom or hallway. Make sure there is a light switch at the top and bottom of the staircase. Turn lights on if you get up in the middle of the night. Make sure lamps or light switches are within reach of the bed if you have to get up during the night.  Kitchen - Install non-skid rubber mats near the sink and stove. Clean spills immediately. Store frequently used utensils, pots, pans between waist and eye level. This helps prevent reaching and bending. Sit when getting things out of lower cupboards.  Living room/ Bedrooms - Place furniture with wide  spaces in between, giving enough room to move around. Establish a route through the living room that gives you something to hold onto as you walk.  Stairs - Make sure treads, rails, and rugs are secure. Install a rail on both sides of the stairs. If stairs are a threat, it might be helpful to arrange most of your activities on the lower level to reduce the number of times you must climb  the stairs.  Entrances and doorways - Install metal handles on the walls adjacent to the doorknobs of all doors to make it more secure as you travel through the doorway.  Tips for maintaining balance Keep at least one hand free at all times. Try using a backpack or fanny pack to hold things rather than carrying them in your hands. Never carry objects in both hands when walking as this interferes with keeping your balance.  Attempt to swing both arms from front to back while walking. This might require a conscious effort if Parkinson's disease has diminished your movement. It will, however, help you to maintain balance and posture, and reduce fatigue.  Consciously lift your feet off of the ground when walking. Shuffling and dragging of the feet is a common culprit in losing your balance.  When trying to navigate turns, use a "U" technique of facing forward and making a wide turn, rather than pivoting sharply.  Try to stand with your feet shoulder-length apart. When your feet are close together for any length of time, you increase your risk of losing your balance and falling.  Do one thing at a time. Don't try to walk and accomplish another task, such as reading or looking around. The decrease in your automatic reflexes complicates motor function, so the less distraction, the better.  Do not wear rubber or gripping soled shoes, they might "catch" on the floor and cause tripping.  Move slowly when changing positions. Use deliberate, concentrated movements and, if needed, use a grab bar or walking aid. Count 15 seconds between each movement. For example, when rising from a seated position, wait 15 seconds after standing to begin walking.  If balance is a continuous problem, you might want to consider a walking aid such as a cane, walking stick, or walker. Once you've mastered walking with help, you might be ready to try it on your own again.

## 2023-09-08 LAB — PROTEIN ELECTROPHORESIS, SERUM
Albumin ELP: 4.3 g/dL (ref 3.8–4.8)
Alpha 1: 0.3 g/dL (ref 0.2–0.3)
Alpha 2: 0.7 g/dL (ref 0.5–0.9)
Beta 2: 0.4 g/dL (ref 0.2–0.5)
Beta Globulin: 0.4 g/dL (ref 0.4–0.6)
Gamma Globulin: 1.1 g/dL (ref 0.8–1.7)
Total Protein: 7.2 g/dL (ref 6.1–8.1)

## 2023-09-08 LAB — KAPPA/LAMBDA LIGHT CHAINS
Kappa free light chain: 66.3 mg/L — ABNORMAL HIGH (ref 3.3–19.4)
Kappa:Lambda Ratio: 1.42 (ref 0.26–1.65)
Lambda Free Lght Chn: 46.8 mg/L — ABNORMAL HIGH (ref 5.7–26.3)

## 2023-09-15 ENCOUNTER — Encounter: Payer: Self-pay | Admitting: Neurology

## 2023-09-15 ENCOUNTER — Ambulatory Visit: Payer: Medicare HMO | Admitting: Pulmonary Disease

## 2023-09-15 ENCOUNTER — Ambulatory Visit: Payer: Medicare HMO | Admitting: Neurology

## 2023-09-16 ENCOUNTER — Ambulatory Visit: Payer: Medicare HMO | Admitting: Physical Therapy

## 2023-09-20 ENCOUNTER — Ambulatory Visit
Admission: RE | Admit: 2023-09-20 | Discharge: 2023-09-20 | Disposition: A | Discharge status: Home / Self Care | Payer: Medicare HMO | Source: Ambulatory Visit | Attending: Neurology

## 2023-09-20 DIAGNOSIS — R209 Unspecified disturbances of skin sensation: Secondary | ICD-10-CM

## 2023-09-20 DIAGNOSIS — R2689 Other abnormalities of gait and mobility: Secondary | ICD-10-CM

## 2023-09-20 DIAGNOSIS — M21371 Foot drop, right foot: Secondary | ICD-10-CM

## 2023-09-20 DIAGNOSIS — M5126 Other intervertebral disc displacement, lumbar region: Secondary | ICD-10-CM | POA: Diagnosis not present

## 2023-09-20 DIAGNOSIS — M5410 Radiculopathy, site unspecified: Secondary | ICD-10-CM

## 2023-09-20 DIAGNOSIS — R269 Unspecified abnormalities of gait and mobility: Secondary | ICD-10-CM

## 2023-09-20 DIAGNOSIS — M47816 Spondylosis without myelopathy or radiculopathy, lumbar region: Secondary | ICD-10-CM | POA: Diagnosis not present

## 2023-09-20 DIAGNOSIS — R253 Fasciculation: Secondary | ICD-10-CM

## 2023-09-20 DIAGNOSIS — G629 Polyneuropathy, unspecified: Secondary | ICD-10-CM

## 2023-09-22 NOTE — Therapy (Signed)
OUTPATIENT PHYSICAL THERAPY NEURO EVALUATION   Patient Name: Harry Lucas MRN: [id number] DOB:[birth year], 78 y.o., male Today's Date: 09/23/2023   PCP: Adrian Prince, MD  REFERRING PROVIDER: Antony Madura, MD  END OF SESSION:  PT End of Session - 09/23/23 1013     Visit Number 1    Number of Visits 1    Date for PT Re-Evaluation 09/23/23    Authorization Type [employer]    PT Start Time 0935    PT Stop Time 1009    PT Time Calculation (min) 34 min    Activity Tolerance Patient tolerated treatment well    Behavior During Therapy WFL for tasks assessed/performed             Past Medical History:  Diagnosis Date   Coronary artery disease    Eczema    No past surgical history on file. Patient Active Problem List   Diagnosis Date Noted   COPD (chronic obstructive pulmonary disease) (HCC) 05/28/2023   Oral candida 10/11/2021   Tachycardia 10/11/2021   Lung nodule 10/11/2021   Rhinitis 10/01/2021   Diarrhea    CAP (community acquired pneumonia) 08/11/2021   Acute respiratory failure with hypoxia (HCC) 08/11/2021   Acute bronchitis with COPD (HCC) 08/11/2021   Hyponatremia 08/11/2021   Dehydration 08/11/2021   AKI (acute kidney injury) (HCC) 08/11/2021   Sepsis (HCC) 08/11/2021   Intrinsic eczema 01/25/2021   OSA on CPAP 01/25/2021   Coronary artery calcification seen on CAT scan 01/17/2020   Aortic atherosclerosis (HCC) 01/17/2020    ONSET DATE: 1.5 years   REFERRING DIAG: 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,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 (ICD-10-CM) - Foot drop, 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 Was not aware that he had a limp but reports that he has bad balance. Patient reports frustration as he reports that he feels that he is being "nickel and dimed" by health providers as he has been sent to several differenc offices for work-up of this problem. Reports that he was expecting to get a 1 time visit and braces at this appointment. Denies N/T in  B LEs.   Pt accompanied by: self  PERTINENT HISTORY: COPD, HTN, hypothyroidism, HLD, CAD, OSA on CPAP  PAIN:  Are you having pain? No  PRECAUTIONS: Fall  RED FLAGS: None   WEIGHT BEARING RESTRICTIONS: No  FALLS: Has patient fallen in last 6 months? 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  DIAGNOSTIC FINDINGS: lumbar MRI 09/20/23- results not yet posted  COGNITION: Overall cognitive status: Within functional limits for tasks assessed   SENSATION: Pt denies N/T in LEs    POSTURE: rounded shoulders and increased thoracic kyphosis  LOWER EXTREMITY ROM:     Active  Right Eval Left Eval  Hip flexion    Hip extension    Hip abduction    Hip adduction    Hip internal rotation    Hip external rotation    Knee flexion    Knee extension    Ankle dorsiflexion 1 -7  Ankle plantarflexion    Ankle inversion    Ankle eversion     (Blank rows = not tested)  LOWER EXTREMITY MMT:    MMT Right Eval Left Eval  Hip flexion 5 5  Hip extension    Hip abduction 4  4  Hip adduction 4 4  Hip internal rotation    Hip external rotation    Knee flexion 4+ 4  Knee extension 4 4  Ankle dorsiflexion 2+ 2+  Ankle plantarflexion 3+ 4  Ankle inversion    Ankle eversion    (Blank rows = not tested)  GAIT: Gait pattern: wide BOS, B foot slap, unsteadiness; near fall in exam room Assistive device utilized: None Level of assistance: SBA                                                                                                                               TREATMENT DATE: 09/23/23   attempted gait training with B AFOs but unable to fit AFOs into patient's shoes d/t size of shoes and wearing slip ons rather than lace up shoes today   PATIENT EDUCATION: Education details: exam findings, edu on benefit of AFOs, how to obtain through insurance through [facility]; received pt's verbal consent to send his personal information to [facility] in order to assist him in getting AFOs. agreed on 1 time visit as patient reports that he cannot afford PT Person educated: Patient Education method: Explanation and Handouts Education comprehension: verbalized understanding  HOME EXERCISE PROGRAM: Not initiated    GOALS: Goals reviewed with patient? Yes  SHORT TERM GOALS: Target date: 09/23/2023  Patient to report understanding of benefit of AFOs for improved stability.  Baseline: edu provided  Goal status: MET    ASSESSMENT:  CLINICAL IMPRESSION:  Patient is a 78 y/o M presenting to OPPT, requesting 1 time visit for evaluation for B AFOs. Patient today presenting with rounded posture, imbalance and near-fall walking to exam room, decreased B ankle DF AROM, marked B ankle weakness, and gait deviations. Attempted to trial AFOs on patient today but they would not fit in his shoes. Instead took time to educate patient on these devices and how to get them through [facility]. Patient reported understanding; 1 time visit today per patient's request.      OBJECTIVE IMPAIRMENTS: Abnormal gait, decreased balance, difficulty walking, decreased ROM, decreased strength, and postural dysfunction.   ACTIVITY LIMITATIONS: carrying, lifting, bending, standing, squatting, stairs, transfers, bathing, toileting, dressing, reach over head, hygiene/grooming, and locomotion level  PARTICIPATION LIMITATIONS: meal prep, cleaning, laundry, driving, shopping, community activity, and church  PERSONAL FACTORS: Age, Past/current experiences, Time since onset of injury/illness/exacerbation, and 3+ comorbidities: COPD, HTN, hypothyroidism, HLD, CAD, OSA on CPAP  are also affecting patient's functional outcome.   REHAB POTENTIAL: Good  CLINICAL DECISION MAKING: Evolving/moderate complexity  EVALUATION COMPLEXITY: Moderate  PLAN:  PT FREQUENCY: one time visit  PT DURATION: -  PLANNED INTERVENTIONS: 97110-Therapeutic exercises, 97530- Therapeutic activity, [id number]- Neuromuscular re-education, 97535- Self Care, and 16109- Manual therapy  PLAN FOR NEXT SESSION: DC at this time    PHYSICAL THERAPY DISCHARGE SUMMARY  Visits from Start of Care: 1  Current functional level related to goals / functional outcomes: See above clinical impression    Remaining deficits: See above   Education / Equipment: Edu on AFOs and how to obtain  Plan: Patient agrees to discharge.  Patient goals were met. Patient is being discharged due to patient's request.     Baldemar Friday, PT, DPT 09/23/23 12:06 PM  [facility] at [facility] [address] [place], [address] [place], [place] [address] Phone # [phone] Fax # [phone]

## 2023-09-23 ENCOUNTER — Other Ambulatory Visit: Payer: Self-pay

## 2023-09-23 ENCOUNTER — Telehealth: Payer: Self-pay | Admitting: Physical Therapy

## 2023-09-23 ENCOUNTER — Ambulatory Visit: Payer: Medicare HMO | Attending: Neurology | Admitting: Physical Therapy

## 2023-09-23 DIAGNOSIS — G629 Polyneuropathy, unspecified: Secondary | ICD-10-CM | POA: Diagnosis not present

## 2023-09-23 DIAGNOSIS — M21372 Foot drop, left foot: Secondary | ICD-10-CM | POA: Diagnosis not present

## 2023-09-23 DIAGNOSIS — R2689 Other abnormalities of gait and mobility: Secondary | ICD-10-CM

## 2023-09-23 DIAGNOSIS — R209 Unspecified disturbances of skin sensation: Secondary | ICD-10-CM | POA: Insufficient documentation

## 2023-09-23 DIAGNOSIS — R269 Unspecified abnormalities of gait and mobility: Secondary | ICD-10-CM | POA: Insufficient documentation

## 2023-09-23 DIAGNOSIS — R253 Fasciculation: Secondary | ICD-10-CM | POA: Diagnosis not present

## 2023-09-23 DIAGNOSIS — M21371 Foot drop, right foot: Secondary | ICD-10-CM

## 2023-09-23 DIAGNOSIS — R2681 Unsteadiness on feet: Secondary | ICD-10-CM | POA: Diagnosis not present

## 2023-09-23 DIAGNOSIS — M5410 Radiculopathy, site unspecified: Secondary | ICD-10-CM | POA: Insufficient documentation

## 2023-09-23 NOTE — Telephone Encounter (Signed)
Hi Dr. Loleta Chance,  I saw Harry Lucas today in OPPT for evaluation. I received your note about getting the patient set up with B AFOs, which I agree that he would benefit from.  Due to insurance regulations, patients must have an order for AFOs.  If you agree, please submit request in EPIC under MD Order, Other Orders (list B AFO in comments) and please include the ICD-10 code, MD signature, and NPI. You may also fax to [facility] at [phone].   Thank you,  Harry Lucas, PT, DPT 09/23/23 12:11 PM  [facility] at [facility] [address] [place], [address] [place], [place] [address] Phone # [phone] Fax # [phone]

## 2023-09-23 NOTE — Patient Instructions (Signed)
New Orthotics Your physical therapist has recommended orthotics as part of your treatment plan. Financial coverage for orthotics varies significantly among insurance carriers and individual plans. In order to minimize the costs you will be responsible for, it is important that you do the following: Schedule an appointment (face-to-face visit required) with the physician who referred you for physical therapy OR your primary care provider. Provide the attached letter to the physician along with the prescription for orthotics.  Please note that the physician's documentation for that visit MUST include the benefit of bracing. If a face to face visit is not documented, your insurance MAY NOT cover the orthotics and you would be responsible for the cost.  Once you have completed this appointment AND have the signed prescription for orthotics, call the orthotist to schedule a time:  For the orthotist to attend one of your physical therapy sessions for the orthotic evaluation.  For you to visit their office for the orthotic evaluation.  When you call to schedule, please be prepared to update the patient's information including the insurance you plan to file.    PT Recommendation made by: Margrett Rud, PT, DPT    [facility] Address: 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), right foot M21.42 Flat foot [pes planus] (acquired), left foot R26.9 Unspecified abnormalities of gait and mobility M25.60 Stiffness of Joint M25.672 Stiffness of ankle joint, left foot M25.671 Stiffness of ankle joint, right foot  M25.673 Stiffness of ankle joint, unspecified  laterality Other _____________________________ Orthotics/DME Bilateral Custom Ankle Foot Orthosis Left Custom Ankle Foot Orthosis Right Custom Ankle Foot Orthosis Left Ankle Foot Orthosis Right Ankle Foot Orthosis Insert Orthotic  Shoes to Accommodate Orthotics Other: __________________________  MD Signature ______________________________________     Date ______________________

## 2023-09-24 ENCOUNTER — Other Ambulatory Visit: Payer: Self-pay

## 2023-09-24 DIAGNOSIS — R2689 Other abnormalities of gait and mobility: Secondary | ICD-10-CM

## 2023-09-24 DIAGNOSIS — G629 Polyneuropathy, unspecified: Secondary | ICD-10-CM

## 2023-09-24 DIAGNOSIS — M21371 Foot drop, right foot: Secondary | ICD-10-CM

## 2023-09-24 DIAGNOSIS — R269 Unspecified abnormalities of gait and mobility: Secondary | ICD-10-CM

## 2023-09-25 NOTE — Telephone Encounter (Signed)
Faxed order to Harry Lucas 09/24/23

## 2023-09-30 ENCOUNTER — Ambulatory Visit: Payer: Medicare HMO | Admitting: Nurse Practitioner

## 2023-10-01 ENCOUNTER — Ambulatory Visit: Payer: Medicare HMO | Admitting: Nurse Practitioner

## 2023-10-03 ENCOUNTER — Telehealth: Payer: Self-pay | Admitting: Neurology

## 2023-10-03 NOTE — Telephone Encounter (Signed)
 Patient returned my call. 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 He is unhappy at the amount of money he has spent looking for answers. He does not wish further referrals or to see me again. He said he will try the braces then hung up on me.  Jacquelyne Balint, MD [facility]

## 2023-10-03 NOTE — Telephone Encounter (Signed)
 Attempted to call patient to discuss results of MRI lumbar spine. I left a message asking for a call back and will also send a MyChart message.  Jacquelyne Balint, MD [facility]

## 2023-10-06 ENCOUNTER — Telehealth: Payer: Self-pay | Admitting: Neurology

## 2023-10-06 NOTE — Telephone Encounter (Signed)
 Pt called in and left a message with the after hours service on 10/04/23. Pt states he is sorry for how he spoke with the doctor. He would like to discuss the two procedures that were mentioned.

## 2023-10-06 NOTE — Telephone Encounter (Signed)
 Patient called our office back with a change of his mind regarding spine surgery referral. I called him back to discuss. He would like to talk to spine surgery and see his options and if there was anything intervention that could help his symptoms.  We will order this referral to spine surgery.  All questions were answered.  Jacquelyne Balint, MD [facility]

## 2023-10-08 ENCOUNTER — Other Ambulatory Visit: Payer: Self-pay

## 2023-10-08 DIAGNOSIS — M21371 Foot drop, right foot: Secondary | ICD-10-CM

## 2023-10-08 DIAGNOSIS — M4807 Spinal stenosis, lumbosacral region: Secondary | ICD-10-CM

## 2023-10-17 ENCOUNTER — Telehealth: Payer: Self-pay | Admitting: Neurology

## 2023-10-17 NOTE — Telephone Encounter (Signed)
Resending referral

## 2023-10-17 NOTE — Telephone Encounter (Signed)
 Left message with the after hour service on 10-17-23   Caller states that he would like to leave a message for Dr Loleta Chance. Need a second opinion to send MRI imaging Dr Ree Kida [phone]. Surgeon can't not do anything without a referral  needs to speak to someone

## 2023-10-27 ENCOUNTER — Ambulatory Visit: Payer: Medicare HMO | Admitting: Adult Health

## 2023-11-11 DIAGNOSIS — G608 Other hereditary and idiopathic neuropathies: Secondary | ICD-10-CM | POA: Diagnosis not present
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
## 2023-11-21 ENCOUNTER — Ambulatory Visit: Payer: Medicare HMO | Admitting: Neurology

## 2023-11-25 NOTE — Therapy (Signed)
 OUTPATIENT PHYSICAL THERAPY NEURO EVALUATION   Patient Name: Harry Lucas MRN: [id number] DOB:30-Dec-1945, 78 y.o., male Today's Date: 11/26/2023   PCP:    Rosslyn Coons, MD   REFERRING PROVIDER: Gearl Keens, MD  END OF SESSION:  PT End of Session - 11/26/23 1105     Visit Number 1    Number of Visits 7    Date for PT Re-Evaluation 01/07/24    Authorization Type [employer]    PT Start Time 0932    PT Stop Time 1010    PT Time Calculation (min) 38 min    Equipment Utilized During Treatment Gait belt    Activity Tolerance Patient tolerated treatment well    Behavior During Therapy WFL for tasks assessed/performed             Past Medical History:  Diagnosis Date   Coronary artery disease    Eczema    No past surgical history on file. Patient Active Problem List   Diagnosis Date Noted   COPD (chronic obstructive pulmonary disease) (HCC) 05/28/2023   Oral candida 10/11/2021   Tachycardia 10/11/2021   Lung nodule 10/11/2021   Rhinitis 10/01/2021   Diarrhea    CAP (community acquired pneumonia) 08/11/2021   Acute respiratory failure with hypoxia (HCC) 08/11/2021   Acute bronchitis with COPD (HCC) 08/11/2021   Hyponatremia 08/11/2021   Dehydration 08/11/2021   AKI (acute kidney injury) (HCC) 08/11/2021   Sepsis (HCC) 08/11/2021   Intrinsic eczema 01/25/2021   OSA on CPAP 01/25/2021   Coronary artery calcification seen on CAT scan 01/17/2020   Aortic atherosclerosis (HCC) 01/17/2020    ONSET DATE: years   REFERRING DIAG: G60.8 (ICD-10-CM) - Other hereditary and idiopathic neuropathies  THERAPY DIAG:  Unsteadiness on feet  Other abnormalities of gait and mobility  Muscle weakness (generalized)  Rationale for Evaluation and Treatment: Rehabilitation  SUBJECTIVE:                                                                                                                                                                                              SUBJECTIVE STATEMENT: Reports that he was told that he has neuropathy in B legs. Went to the orthotic clinic recommended at last POC and was told that he had a $500 deductible so instead he got a brace online. Reports most unsteadiness walking on grass such as when golfing. Reports that he is not concerned with his "limp" but wants to work on his balance.     Pt accompanied by: self  PERTINENT HISTORY: COPD, HTN, hypothyroidism, HLD, CAD, OSA on CPAP  PAIN:  Are you having  pain? No  PRECAUTIONS: Fall  RED FLAGS: None   WEIGHT BEARING RESTRICTIONS: No  FALLS: Has patient fallen in last 6 months? Yes. Number of falls 1  LIVING ENVIRONMENT: Lives with: lives with their spouse and daughter Lives in: House/apartment Stairs:  2nd story and has a Theatre manager that isn't working Has following equipment at home: None  PLOF: Independent; likes to golf   PATIENT GOALS: work on my balance  OBJECTIVE:  Note: Objective measures were completed at Evaluation unless otherwise noted.  DIAGNOSTIC FINDINGS: 09/20/23 lumbar MRI: At L5-S1, severe left and moderate right neural foraminal narrowing. 2. At L4-L5, left eccentric disc bulge and facet arthropathy results in displacement of the traversing left L5 nerve root in the subarticular zone and moderate bilateral neural foraminal narrowing.  COGNITION: Overall cognitive status: Within functional limits for tasks assessed   SENSATION: Pt denies N/T in B LEs   COORDINATION: Alternating pronation/supination: slightly dysmetria B  Alternating toe tap: intact B    POSTURE: rounded shoulders and forward head *pt wears foot up brace on L foot  LOWER EXTREMITY MMT:    MMT Right Eval Left Eval  Hip flexion 4+ 4+  Hip extension    Hip abduction 4 4  Hip adduction 4 4  Hip internal rotation    Hip external rotation    Knee flexion 4 4+  Knee extension 4 4+  Ankle dorsiflexion 3 2  Ankle plantarflexion 3+ 2+  Ankle inversion     Ankle eversion    (Blank rows = not tested)  GAIT: Wide BOS and B foot slap; pt very unsteady and reaches for items in gym for support    FUNCTIONAL TESTS:  5 times sit to stand: 10.34 sec without UEs with posterior LOB into chair 10 meter walk test: 15.56 sec without AD (2.43ft.sec) Dynamic Gait Index: 11/24 *pt struggles to maintain standing balance without holding onto therapist                                                                                                                                TREATMENT DATE: 11/26/23   Gait training with quad tip cane with and without counter top support or HHA and cueing for proper sequencing. Pt very unsteady and requires consistent cueing for sequencing.    PATIENT EDUCATION: Education details: prognosis, POC, edu on benefits of PT for balance and stressed need for consistent HEP compliance once HEP is initiated, edu on impairments and need for AD use d/t fall risk  Person educated: Patient Education method: Explanation, Demonstration, Tactile cues, and Verbal cues Education comprehension: verbalized understanding  HOME EXERCISE PROGRAM: Not initiated d/t time   GOALS: Goals reviewed with patient? Yes  SHORT TERM GOALS: Target date: 12/17/2023  Patient to be independent with initial HEP. Baseline: HEP initiated Goal status: INITIAL    LONG TERM GOALS: Target date: 01/07/2024  Patient to be independent with advanced HEP. Baseline: Not yet  initiated  Goal status: INITIAL  Patient to score at least 16/24 on DGI in order to decrease risk of falls.  Baseline: 11/24 Goal status: INITIAL  Patient to demonstrate 5xSTS test in <15 sec without posterior lean or LOB in order to decrease risk of falls.  Baseline: 10.34 sec without UEs with posterior LOB into chair Goal status: INITIAL  Patient to demonstrate consistent use of LRAD for improved safety with gait.  Baseline: does not own AD Goal status:  INITIAL  ASSESSMENT:  CLINICAL IMPRESSION:  Patient is a 78 y/o M presenting to OPPT with c/o imbalance. Reports 1 fall in the past 6 months. Patient was seen at this clinic on 09/23/23 for evaluation prior to getting B AFOs which were recommended, however patient purchased a L foot up brace instead d/t cost. Patient today presenting with Slight dysmetria in B UEs, rounded shoulders, LE weakness most prominent distally, gait deviations, decreased gait speed, and significant imbalance. Patient's score on DGI indicates an increased risk of falls. Trialed gait training with quad tip cane d/t patient's imbalance today, which will require increased practice. Would benefit from skilled PT services 1 x/week for 6 weeks to address aforementioned impairments in order to optimize level of function.    OBJECTIVE IMPAIRMENTS: Abnormal gait, decreased activity tolerance, decreased balance, decreased coordination, decreased knowledge of use of DME, difficulty walking, decreased ROM, decreased strength, and postural dysfunction.   ACTIVITY LIMITATIONS: carrying, lifting, bending, standing, squatting, stairs, transfers, bathing, toileting, dressing, reach over head, hygiene/grooming, locomotion level, and caring for others  PARTICIPATION LIMITATIONS: meal prep, cleaning, laundry, shopping, community activity, yard work, and church  PERSONAL FACTORS: Age, Past/current experiences, Time since onset of injury/illness/exacerbation, and 3+ comorbidities: COPD, HTN, hypothyroidism, HLD, CAD, OSA on CPAP  are also affecting patient's functional outcome.   REHAB POTENTIAL: Good  CLINICAL DECISION MAKING: Evolving/moderate complexity  EVALUATION COMPLEXITY: Moderate  PLAN:  PT FREQUENCY: 1x/week  PT DURATION: 6 weeks  PLANNED INTERVENTIONS: 97164- PT Re-evaluation, 97750- Physical Performance Testing, 97110-Therapeutic exercises, 97530- Therapeutic activity, 97112- Neuromuscular re-education, 97535- Self Care,  97140- Manual therapy, [phone]- Gait training, 52841- Orthotic Initial, [phone]- Canalith repositioning, 10272- Aquatic Therapy, [phone]- Electrical stimulation (unattended), [phone]- Electrical stimulation (manual), Patient/Family education, Balance training, Stair training, Taping, Dry Needling, Joint mobilization, Spinal mobilization, Vestibular training, DME instructions, Cryotherapy, and Moist heat  PLAN FOR NEXT SESSION: initiate HEP for static and dynamic balance, continue gait training with quad tip cane vs. Trekking pole; work on Engineer, building services and edu on safety in the home    Thaddeus Filippo, [place], DPT 11/26/23 11:18 AM  [facility] at [facility] [address] [place], [place] [address] Phone # [phone] Fax # [phone]

## 2023-11-26 ENCOUNTER — Ambulatory Visit: Attending: Neurosurgery | Admitting: Physical Therapy

## 2023-11-26 ENCOUNTER — Other Ambulatory Visit: Payer: Self-pay

## 2023-11-26 DIAGNOSIS — R2689 Other abnormalities of gait and mobility: Secondary | ICD-10-CM | POA: Insufficient documentation

## 2023-11-26 DIAGNOSIS — R2681 Unsteadiness on feet: Secondary | ICD-10-CM | POA: Diagnosis present

## 2023-11-26 DIAGNOSIS — M6281 Muscle weakness (generalized): Secondary | ICD-10-CM | POA: Diagnosis present

## 2023-12-01 ENCOUNTER — Ambulatory Visit: Payer: Medicare HMO | Admitting: Podiatry

## 2023-12-01 ENCOUNTER — Ambulatory Visit: Payer: Medicare HMO | Admitting: Nurse Practitioner

## 2023-12-02 ENCOUNTER — Encounter: Payer: Self-pay | Admitting: Nurse Practitioner

## 2023-12-02 ENCOUNTER — Ambulatory Visit: Payer: Medicare HMO | Admitting: Nurse Practitioner

## 2023-12-02 VITALS — BP 110/76 | HR 75 | Ht 67.0 in | Wt 169.4 lb

## 2023-12-02 DIAGNOSIS — J42 Unspecified chronic bronchitis: Secondary | ICD-10-CM

## 2023-12-02 DIAGNOSIS — R911 Solitary pulmonary nodule: Secondary | ICD-10-CM | POA: Diagnosis not present

## 2023-12-02 DIAGNOSIS — J309 Allergic rhinitis, unspecified: Secondary | ICD-10-CM

## 2023-12-02 MED ORDER — BREZTRI AEROSPHERE 160-9-4.8 MCG/ACT IN AERO: 2.0000 | INHALATION_SPRAY | Freq: Two times a day (BID) | RESPIRATORY_TRACT | Status: DC

## 2023-12-02 MED ORDER — FLUTICASONE PROPIONATE 50 MCG/ACT NA SUSP: 2.0000 | Freq: Every day | NASAL | 2 refills | Status: AC

## 2023-12-02 NOTE — Assessment & Plan Note (Signed)
 See above plan.

## 2023-12-02 NOTE — Assessment & Plan Note (Signed)
 Scattered nodularity. Slight increase in LLL. Possible chronic infectious process. No active infectious symptoms. Will plan for repeat CT chest in 6 months.

## 2023-12-02 NOTE — Assessment & Plan Note (Signed)
 COPD with chronic bronchitis. Slight increase in DOE over the last month. Lung exam today is clear. Does not appear to be in acute exacerbation. Possible allergic component and related to sinus symptoms. Recommend he start non drowsy antihistamine daily and intranasal steroid with flonase . Advised to notify about rx for singulair . Will follow up on his patient assistance for Breztri . Encouraged to remain active. Repeat PFT at follow up to assess for decline/progression of disease. Walk test at follow up. Action plan in place.   Patient Instructions  Continue Albuterol  inhaler 2 puffs or 3 mL neb every 6 hours as needed for shortness of breath or wheezing. Notify if symptoms persist despite rescue inhaler/neb use. Continue Breztri  2 puffs Twice daily. Brush tongue and rinse mouth afterwards  Continue claritin  1 tab daily  Check to see if you have montelukast  (singulair ) at home. If not, call me and I will send in a new prescription Use guaifenesin  (mucinex ) 600 mg Twice daily as needed for cough/congestion  Restart flonase  nasal spray 2 sprays each nostril daily for allergies/nasal drainage  We will follow up on the Breztri  assistance   Repeat CT chest June/July 2025 for 6 month follow up  Follow up in 12 weeks after repeat PFT with Dr. Diania Fortes (new pt 30 min slot). If symptoms do not improve or worsen, please contact office for sooner follow up or seek emergency care.

## 2023-12-02 NOTE — Progress Notes (Signed)
 @Patient  ID: Harry Lucas, male    DOB: 1945-09-13, 78 y.o.   MRN: [id number]  Chief Complaint  Patient presents with   Follow-up    Patient is having sob. No improvement.    Referring provider: Rosslyn Coons, MD  HPI: 78 year old male, former smoker followed for COPD and lung nodules.  He is a former patient of Dr. Glenis Langdon and last seen in office on 05/28/2023 by Parrett NP.  Past medical history significant for OSA on CPAP, aortic atherosclerosis, CAD.  TEST/EVENTS:  11/19/2017 PFTs: FVC 3.09 (81%), FEV1 1.23 (45%), ratio 40.  Severe obstructive airways disease with significant bronchodilator response 08/18/2023 CT chest: atherosclerosis. Emphysema. Scattered central airway thickening and biapical scarring. Unchanged clustered nodularity lingula. Mild increased nodularity in LLL. Partially cleared nodularity in RML. Additional scattered small nodule, stable.   05/28/2023: OV with Parrett, NP. Seen in August for slow to resolve exacerbation. Quit smoking since last visit. Remains on breztri . Was treated with doxy and prednisone . Feeling much better. Cough has decreased. Remains active; playing golf twice a week. 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 He's on singulair . Using claritin  as needed.   12/02/2023: Today - follow up Discussed the use of AI scribe software for clinical note transcription with the patient, who gave verbal consent to proceed.  History of Present Illness   Harry Lucas is an 78 year old male with COPD and chronic bronchitis who presents for follow-up.  He feels about the same as his last visit. He does have a chronic cough. He experiences phlegm production, which he attributes to allergies and his history of smoking. It is yellow to white. He has not been using Mucinex . He does feel like it gets worse with allergy season.   He is not currently using Flonase  or Astelin  nasal sprays but has been using Claritin  for allergy symptoms. His sinuses are  not congested but are running, and he experiences watery eyes, especially after golfing.  He has been on methotrexate  for approximately twelve years for eczema, along with folic acid. He is unsure if he is currently taking Singulair  (montelukast ) and plans to check his medication regimen at home.  He has noticed increased shortness of breath, particularly when climbing stairs over the past month. He uses Breztri  inhaler twice in the morning and twice at night, and gargles after use. He also uses his rescue inhaler, albuterol , primarily when golfing or engaging in outdoor activities, but not on a daily basis. No hemoptysis, fevers, chills, night sweats, or wheezing. He uses a CPAP machine and has been on it for about twenty years. He carries his albuterol  inhaler with him at all times but does not need it frequently.       No Known Allergies  Immunization History  Administered Date(s) Administered   Fluad Quad(high Dose 65+) 09/25/2022   Fluad Trivalent(High Dose 65+) 05/28/2023   Influenza, Quadrivalent, Recombinant, Inj, Pf 05/21/2019, 06/28/2021   Influenza-Unspecified 05/07/2017, 06/26/2018   PFIZER(Purple Top)SARS-COV-2 Vaccination 10/08/2019, 11/02/2019    Past Medical History:  Diagnosis Date   Coronary artery disease    Eczema     Tobacco History: Social History   Tobacco Use  Smoking Status Former   Current packs/day: 0.00   Types: Cigarettes   Quit date: 07/13/2019   Years since quitting: 4.3   Passive exposure: Past  Smokeless Tobacco Never   Counseling given: Not Answered   Outpatient Medications Prior to Visit  Medication Sig Dispense Refill   acetaminophen  (TYLENOL ) 500 MG  tablet Take 1,000 mg by mouth every 6 (six) hours as needed for headache (pain).     albuterol  (PROVENTIL ) (2.5 MG/3ML) 0.083% nebulizer solution Take 3 mLs (2.5 mg total) by nebulization every 6 (six) hours as needed for wheezing or shortness of breath. 75 mL 12   albuterol  (VENTOLIN  HFA)  108 (90 Base) MCG/ACT inhaler Inhale 1-2 puffs into the lungs every 6 (six) hours as needed for wheezing or shortness of breath. 1 each 11   azelastine  (ASTELIN ) 0.1 % nasal spray Place 2 sprays into both nostrils 2 (two) times daily. Use in each nostril as directed 30 mL 2   betamethasone dipropionate 0.05 % lotion Apply 1 application topically daily as needed (psoriasis/eczema).     Budeson-Glycopyrrol-Formoterol  (BREZTRI  AEROSPHERE) 160-9-4.8 MCG/ACT AERO Inhale 2 puffs into the lungs in the morning and at bedtime. 10.7 g 11   cholecalciferol (VITAMIN D) 25 MCG (1000 UNIT) tablet Take 1,000 Units by mouth every morning.     FLUoxetine (PROZAC) 40 MG capsule Take 40 mg by mouth every morning.     folic acid (FOLVITE) 1 MG tablet Take 1 mg by mouth every morning.     Glycopyrrolate-Formoterol  (BEVESPI  AEROSPHERE) 9-4.8 MCG/ACT AERO Inhale 2 puffs into the lungs 2 (two) times daily. 10.7 g 11   hydrocortisone 2.5 % cream Apply 1 application topically 2 (two) times daily as needed (psoriasis/eczema).     levothyroxine (SYNTHROID) 175 MCG tablet Take 150 mcg by mouth daily before breakfast.     loratadine  (CLARITIN ) 10 MG tablet Take 1 tablet (10 mg total) by mouth daily. 30 tablet 5   losartan (COZAAR) 25 MG tablet Take 25 mg by mouth daily.     magnesium oxide (MAG-OX) 400 (240 Mg) MG tablet Take 400 mg by mouth daily.     methotrexate  (RHEUMATREX) 2.5 MG tablet Take 6 tablets (15 mg total) by mouth every Sunday. (Patient taking differently: Take 15 mg by mouth every Sunday. 5 tabs every Sunday) 4 tablet 0   montelukast  (SINGULAIR ) 10 MG tablet Take 10 mg by mouth every morning.     simvastatin  (ZOCOR ) 40 MG tablet Take 40 mg by mouth every morning.     Spacer/Aero-Holding Chambers DEVI 2 puffs by Does not apply route 2 (two) times daily. Use with inhaler twice daily 1 each 0   vitamin B-12 (CYANOCOBALAMIN ) 1000 MCG tablet Take 1,000 mcg by mouth every morning.     fluticasone  (FLONASE ) 50 MCG/ACT  nasal spray Place 1 spray into both nostrils daily. 16 g 2   No facility-administered medications prior to visit.     Review of Systems:   Constitutional: No weight loss or gain, night sweats, fevers, chills, fatigue, or lassitude. HEENT: No headaches, difficulty swallowing, tooth/dental problems, or sore throat. No sneezing, itching, ear ache +nasal congestion, post nasal drip CV:  No chest pain, orthopnea, PND, swelling in lower extremities, anasarca, dizziness, palpitations, syncope Resp: +shortness of breath with exertion; chronic cough. No excess mucus or change in color of mucus. No hemoptysis. No wheezing.  No chest wall deformity GI:  No heartburn, indigestion, abdominal pain, nausea, vomiting, diarrhea, change in bowel habits, loss of appetite, bloody stools.  GU: No dysuria, change in color of urine, urgency or frequency. \ Skin: No rash, lesions, ulcerations MSK:  No joint pain or swelling.   Neuro: No dizziness or lightheadedness.  Psych: No depression or anxiety. Mood stable.     Physical Exam:  BP 110/76 (BP Location: Right Arm, Patient Position:  Sitting, Cuff Size: Normal)   Pulse 75   Ht 5\' 7"  (1.702 m)   Wt 169 lb 6.4 oz (76.8 kg)   SpO2 96%   BMI 26.53 kg/m   GEN: Pleasant, interactive, well-nourished; in no acute distress. HEENT:  Normocephalic and atraumatic. EACs patent bilaterally. TM pearly gray with present light reflex bilaterally. PERRLA. Sclera white. Nasal turbinates boggy, moist and patent bilaterally. No rhinorrhea present. Oropharynx pink and moist, without exudate or edema. No lesions, ulcerations  NECK:  Supple w/ fair ROM. No JVD present. Normal carotid impulses w/o bruits. Thyroid  symmetrical with no goiter or nodules palpated. No lymphadenopathy.   CV: RRR, no m/r/g, no peripheral edema. Pulses intact, +2 bilaterally. No cyanosis, pallor or clubbing. PULMONARY:  Unlabored, regular breathing. Clear bilaterally A&P w/o wheezes/rales/rhonchi. No  accessory muscle use. No dullness to percussion. GI: BS present and normoactive. Soft, non-tender to palpation. No organomegaly or masses detected.  MSK: No erythema, warmth or tenderness. Cap refil <2 sec all extrem.  Neuro: A/Ox3. No focal deficits noted.   Skin: Warm, no lesions or rashe Psych: Normal affect and behavior. Judgement and thought content appropriate.     Lab Results:  CBC    Component Value Date/Time   WBC 15.3 (H) 08/12/2021 0430   RBC 3.44 (L) 08/12/2021 0430   HGB 11.5 (L) 08/12/2021 0430   HCT 34.9 (L) 08/12/2021 0430   PLT 246 08/12/2021 0430   MCV 101.5 (H) 08/12/2021 0430   MCH 33.4 08/12/2021 0430   MCHC 33.0 08/12/2021 0430   RDW 13.3 08/12/2021 0430   LYMPHSABS 0.2 (L) 08/12/2021 0430   MONOABS 0.4 08/12/2021 0430   EOSABS 0.0 08/12/2021 0430   BASOSABS 0.0 08/12/2021 0430    BMET    Component Value Date/Time   NA 136 08/12/2021 0430   K 4.3 08/12/2021 0430   CL 104 08/12/2021 0430   CO2 22 08/12/2021 0430   GLUCOSE 155 (H) 08/12/2021 0430   BUN 20 08/12/2021 0430   CREATININE 1.57 (H) 08/12/2021 0430   CALCIUM 8.9 08/12/2021 0430   GFRNONAA 45 (L) 08/12/2021 0430    BNP    Component Value Date/Time   BNP 54.3 08/11/2021 1745     Imaging:  No results found.  Administration History     None          Latest Ref Rng & Units 11/19/2017   11:24 AM  PFT Results  FVC-Pre L 2.16   FVC-Predicted Pre % 57   FVC-Post L 3.09   FVC-Predicted Post % 81   Pre FEV1/FVC % % 43   Post FEV1/FCV % % 40   FEV1-Pre L 0.93   FEV1-Predicted Pre % 34   FEV1-Post L 1.23   DLCO uncorrected ml/min/mmHg 15.25   DLCO UNC% % 53   DLVA Predicted % 82   TLC L 7.55   TLC % Predicted % 117   RV % Predicted % 221     No results found for: "NITRICOXIDE"      Assessment & Plan:   COPD (chronic obstructive pulmonary disease) (HCC) COPD with chronic bronchitis. Slight increase in DOE over the last month. Lung exam today is clear. Does not  appear to be in acute exacerbation. Possible allergic component and related to sinus symptoms. Recommend he start non drowsy antihistamine daily and intranasal steroid with flonase . Advised to notify about rx for singulair . Will follow up on his patient assistance for Breztri . Encouraged to remain active. Repeat PFT at follow  up to assess for decline/progression of disease. Walk test at follow up. Action plan in place.   Patient Instructions  Continue Albuterol  inhaler 2 puffs or 3 mL neb every 6 hours as needed for shortness of breath or wheezing. Notify if symptoms persist despite rescue inhaler/neb use. Continue Breztri  2 puffs Twice daily. Brush tongue and rinse mouth afterwards  Continue claritin  1 tab daily  Check to see if you have montelukast  (singulair ) at home. If not, call me and I will send in a new prescription Use guaifenesin  (mucinex ) 600 mg Twice daily as needed for cough/congestion  Restart flonase  nasal spray 2 sprays each nostril daily for allergies/nasal drainage  We will follow up on the Breztri  assistance   Repeat CT chest June/July 2025 for 6 month follow up  Follow up in 12 weeks after repeat PFT with Dr. Diania Fortes (new pt 30 min slot). If symptoms do not improve or worsen, please contact office for sooner follow up or seek emergency care.    Lung nodule Scattered nodularity. Slight increase in LLL. Possible chronic infectious process. No active infectious symptoms. Will plan for repeat CT chest in 6 months.   Allergic rhinitis See above plan    I spent 38 minutes of dedicated to the care of this patient on the date of this encounter to include pre-visit review of records, face-to-face time with the patient discussing conditions above, post visit ordering of testing, clinical documentation with the electronic health record, making appropriate referrals as documented, and communicating necessary findings to members of the patients care team.  Roetta Clarke,  NP 12/02/2023  Pt aware and understands NP's role.

## 2023-12-02 NOTE — Patient Instructions (Addendum)
 Continue Albuterol  inhaler 2 puffs or 3 mL neb every 6 hours as needed for shortness of breath or wheezing. Notify if symptoms persist despite rescue inhaler/neb use. Continue Breztri  2 puffs Twice daily. Brush tongue and rinse mouth afterwards  Continue claritin  1 tab daily  Check to see if you have montelukast  (singulair ) at home. If not, call me and I will send in a new prescription Use guaifenesin  (mucinex ) 600 mg Twice daily as needed for cough/congestion  Restart flonase  nasal spray 2 sprays each nostril daily for allergies/nasal drainage  We will follow up on the Breztri  assistance   Repeat CT chest June/July 2025 for 6 month follow up  Follow up in 12 weeks after repeat PFT with Dr. Diania Fortes (new pt 30 min slot). If symptoms do not improve or worsen, please contact office for sooner follow up or seek emergency care.

## 2023-12-03 ENCOUNTER — Ambulatory Visit: Admitting: Physical Therapy

## 2023-12-03 ENCOUNTER — Encounter: Payer: Self-pay | Admitting: Physical Therapy

## 2023-12-03 ENCOUNTER — Telehealth: Payer: Self-pay | Admitting: *Deleted

## 2023-12-03 DIAGNOSIS — R2681 Unsteadiness on feet: Secondary | ICD-10-CM

## 2023-12-03 DIAGNOSIS — M6281 Muscle weakness (generalized): Secondary | ICD-10-CM

## 2023-12-03 DIAGNOSIS — R2689 Other abnormalities of gait and mobility: Secondary | ICD-10-CM

## 2023-12-03 MED ORDER — MONTELUKAST SODIUM 10 MG PO TABS: 10.0000 mg | ORAL_TABLET | Freq: Every morning | ORAL | 3 refills | Status: AC

## 2023-12-03 NOTE — Telephone Encounter (Signed)
 Copied from CRM [id number]. Topic: Clinical - Prescription Issue >> Dec 02, 2023 10:41 AM Crist Dominion wrote: Reason for CRM: Patient was seen at [facility] today was advised by Girard Lam to call if he didn't know have any montelukast  (SINGULAIR ) 10 MG tablet  Please send prescription to:   [facility] [id number] [place], [place] - [address] [place] [address] Phone: [phone] Fax: [phone]   I called the pt and let him know will send rx to preferred pharm for singulair   He verbalized understanding  Nothing further  Rx sent

## 2023-12-03 NOTE — Therapy (Signed)
 OUTPATIENT PHYSICAL THERAPY NEURO TREATMENT NOTE   Patient Name: Harry Lucas MRN: [id number] DOB:03-Jan-1946, 78 y.o., male Today's Date: 12/03/2023   PCP:    Rosslyn Coons, MD   REFERRING PROVIDER: Gearl Keens, MD  END OF SESSION:  PT End of Session - 12/03/23 0831     Visit Number 2    Number of Visits 7    Date for PT Re-Evaluation 01/07/24    Authorization Type [employer]    PT Start Time [phone]    PT Stop Time 0926    PT Time Calculation (min) 40 min    Equipment Utilized During Treatment Gait belt    Activity Tolerance Patient tolerated treatment well    Behavior During Therapy [facility] for tasks assessed/performed             Past Medical History:  Diagnosis Date   Coronary artery disease    Eczema    History reviewed. No pertinent surgical history. 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   OSA on CPAP 01/25/2021   Coronary artery calcification seen on CAT scan 01/17/2020   Aortic atherosclerosis (HCC) 01/17/2020    ONSET DATE: years   REFERRING DIAG: 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  Wear the brace (that I got online) all day long.  Pt accompanied by: self  PERTINENT HISTORY: COPD, HTN, hypothyroidism, HLD, CAD, OSA on CPAP  PAIN:  Are you having pain? No  PRECAUTIONS: Fall  RED FLAGS: None   WEIGHT BEARING RESTRICTIONS: No  FALLS: Has patient fallen in last 6 months? Yes. Number of falls 1  LIVING ENVIRONMENT: Lives with: lives with their spouse and daughter Lives in: House/apartment Stairs:  2nd story  and has a chair lift that isn't working Has following equipment at home: None  PLOF: Independent; likes to golf   PATIENT GOALS: work on my balance  OBJECTIVE:    TODAY'S TREATMENT: 12/03/2023 Activity Comments  Feet apart EO/EC head turns/nods Light BUE>1 UE support, increased sway with EC  Feet together EO head turns/nods  1 UE support  Feet together EC head steady 30 sec x 2 reps BUE>1 UE support  Alt step taps 2 x 10, with lessening UE support To 6" step  SLS, 3 x 10" Light UE support  Nustep, Level 3, 4 extremities x 4 min, then 4 min BLEs only SPM >60      M-CTSIB  Condition 1: Firm Surface, EO 30 Sec, Moderate Sway  Condition 2: Firm Surface, EC 3.06 Sec, Severe Sway  Condition 3: Foam Surface, EO 8.1 Sec, Severe Sway  Condition 4: Foam Surface, EC 0 Sec, Severe Sway   HOME EXERCISE PROGRAM: Access Code: [email or web address] URL: [email or web address] Date: 12/03/2023 Prepared by: [facility]  Exercises - Corner Balance Feet Apart: Eyes Closed With Head Turns  - 1 x daily - 7 x weekly - 3 sets - 5 reps - Corner Balance Feet Together: Eyes Open With Head Turns  - 1 x daily - 7 x weekly - 3 sets - 5 reps - Alternating Step Taps with Counter Support  - 1 x daily - 7 x weekly - 2 sets - 10 reps - Single Leg Stance with Support  - 1 x daily - 7 x weekly - 1 sets - 3 reps - 10 sec hold  PATIENT  EDUCATION: Education details: foot care-skin inspection with wearing the brace, HEP initiated; discussed ways to incorporate strengthening exercises at gym (Nustep and recumbent bike) Person educated: Patient Education method: Explanation, Demonstration, and Handouts Education comprehension: verbalized understanding, returned demonstration, and needs further education   ------------------------------------------------------------------- Note: Objective measures were completed at Evaluation unless otherwise noted.  DIAGNOSTIC FINDINGS: 09/20/23 lumbar MRI: At L5-S1, severe left and moderate right neural foraminal narrowing. 2. At L4-L5, left eccentric disc bulge and facet arthropathy results in displacement of the traversing left L5 nerve root in the subarticular zone and moderate bilateral neural foraminal narrowing.  COGNITION: Overall cognitive status: Within functional limits for tasks assessed   SENSATION: Pt denies N/T in B LEs   COORDINATION: Alternating pronation/supination: slightly dysmetria B  Alternating toe tap: intact B    POSTURE: rounded shoulders and forward head *pt wears foot up brace on L foot  LOWER EXTREMITY MMT:    MMT Right Eval Left Eval  Hip flexion 4+ 4+  Hip extension    Hip abduction 4 4  Hip adduction 4 4  Hip internal rotation    Hip external rotation    Knee flexion 4 4+  Knee extension 4 4+  Ankle dorsiflexion 3 2  Ankle plantarflexion 3+ 2+  Ankle inversion    Ankle eversion    (Blank rows = not tested)  GAIT: Wide BOS and B foot slap; pt very unsteady and reaches for items in gym for support    FUNCTIONAL TESTS:  5 times sit to stand: 10.34 sec without UEs with posterior LOB into chair 10 meter walk test: 15.56 sec without AD (2.53ft.sec) Dynamic Gait Index: 11/24 *pt struggles to maintain standing balance without holding onto therapist  TREATMENT DATE: 11/26/23   Gait training with quad tip cane with and without counter top support or HHA and cueing for proper sequencing. Pt very unsteady and requires consistent cueing for sequencing.    PATIENT EDUCATION: Education details: prognosis, POC, edu on benefits of PT for balance and stressed need for consistent HEP compliance once HEP is initiated, edu on impairments and need for AD use d/t fall risk  Person educated: Patient Education method: Explanation, Demonstration, Tactile cues, and Verbal cues Education comprehension: verbalized understanding  HOME EXERCISE PROGRAM: Not initiated d/t time   GOALS: Goals reviewed with patient? Yes  SHORT TERM GOALS: Target date: 12/17/2023  Patient to be independent with initial HEP. Baseline: HEP initiated Goal status: INITIAL    LONG TERM GOALS: Target date: 01/07/2024  Patient to be independent with advanced HEP. Baseline: Not yet initiated  Goal status: INITIAL  Patient to score at least 16/24 on DGI in order to decrease risk of falls.  Baseline: 11/24 Goal status: INITIAL  Patient to demonstrate 5xSTS test in <15 sec without posterior lean or LOB in order to decrease risk of falls.  Baseline: 10.34 sec without UEs with posterior LOB into chair Goal status: INITIAL  Patient to demonstrate consistent use of LRAD for improved safety with gait.  Baseline: does not own AD Goal status: INITIAL  ASSESSMENT:  CLINICAL IMPRESSION: Pt presents today with no new complaints. Skilled PT session focused on balance exercises and initiating HEP. Pt needs at least 1 UE support for stability with balance exercises.  Pt demo significant decreased ability for single limb stance and compliant surface balance.  Pt will continue to benefit from skilled PT towards goals for improved functional mobility and decreased fall risk.    OBJECTIVE IMPAIRMENTS: Abnormal gait, decreased activity tolerance,  decreased balance, decreased coordination, decreased knowledge of use of DME, difficulty walking, decreased ROM, decreased strength, and postural dysfunction.   ACTIVITY LIMITATIONS: carrying, lifting, bending, standing, squatting, stairs, transfers, bathing, toileting, dressing, reach over head, hygiene/grooming, locomotion level, and caring for others  PARTICIPATION LIMITATIONS: meal prep, cleaning, laundry, shopping, community activity, yard work, and church  PERSONAL FACTORS: Age, Past/current experiences, Time since onset of injury/illness/exacerbation, and 3+ comorbidities: COPD, HTN, hypothyroidism, HLD, CAD, OSA on CPAP  are also affecting patient's functional outcome.   REHAB POTENTIAL: Good  CLINICAL DECISION MAKING: Evolving/moderate complexity  EVALUATION COMPLEXITY: Moderate  PLAN:  PT FREQUENCY: 1x/week  PT DURATION: 6 weeks  PLANNED INTERVENTIONS: 97164- PT Re-evaluation, 97750- Physical Performance Testing, 97110-Therapeutic exercises, 97530- Therapeutic activity, 97112- Neuromuscular re-education, 97535- Self Care, 16109- Manual therapy, [phone]- Gait training, [phone]- Orthotic Initial, [phone]- Canalith repositioning, [id number]- Aquatic Therapy, [phone]- Electrical stimulation (unattended), [phone]- Electrical stimulation (manual), Patient/Family education, Balance training, Stair training, Taping, Dry Needling, Joint mobilization, Spinal mobilization, Vestibular training, DME instructions, Cryotherapy, and Moist heat  PLAN FOR NEXT SESSION: Review and progress HEP for static and dynamic balance, continue gait training with quad tip cane vs. Trekking pole; work on Engineer, building services and edu on safety in the home    Dessie Flow, PT 12/03/23 9:30 AM Phone: [phone] Fax: [phone]   [facility] [address], [address] [place], [place] [address] Phone # [phone] Fax # [phone]

## 2023-12-09 ENCOUNTER — Ambulatory Visit: Admitting: Physical Therapy

## 2023-12-09 ENCOUNTER — Encounter: Payer: Self-pay | Admitting: Physical Therapy

## 2023-12-09 DIAGNOSIS — R2681 Unsteadiness on feet: Secondary | ICD-10-CM | POA: Diagnosis not present

## 2023-12-09 DIAGNOSIS — R2689 Other abnormalities of gait and mobility: Secondary | ICD-10-CM

## 2023-12-09 DIAGNOSIS — M6281 Muscle weakness (generalized): Secondary | ICD-10-CM

## 2023-12-09 NOTE — Therapy (Signed)
 OUTPATIENT PHYSICAL THERAPY NEURO TREATMENT NOTE   Patient Name: Harry Lucas MRN: [id number] DOB:1946-04-05, 78 y.o., male Today's Date: 12/09/2023   PCP:    Rosslyn Coons, MD   REFERRING PROVIDER: Gearl Keens, MD  END OF SESSION:  PT End of Session - 12/09/23 0848     Visit Number 3    Number of Visits 7    Date for PT Re-Evaluation 01/07/24    Authorization Type [employer]    PT Start Time [id number]    PT Stop Time 0929    PT Time Calculation (min) 39 min    Equipment Utilized During Treatment Gait belt    Activity Tolerance Patient tolerated treatment well    Behavior During Therapy [facility] for tasks assessed/performed              Past Medical History:  Diagnosis Date   Coronary artery disease    Eczema    History reviewed. No pertinent surgical history. 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   OSA on CPAP 01/25/2021   Coronary artery calcification seen on CAT scan 01/17/2020   Aortic atherosclerosis (HCC) 01/17/2020    ONSET DATE: years   REFERRING DIAG: 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  Pt accompanied by: self  PERTINENT HISTORY: COPD, HTN, hypothyroidism, HLD, CAD, OSA on CPAP  PAIN:  Are you having pain? No  PRECAUTIONS: Fall  RED FLAGS: None   WEIGHT BEARING RESTRICTIONS: No  FALLS: Has patient fallen in last 6 months? Yes. Number of falls 1  LIVING ENVIRONMENT: Lives with: lives with their spouse and daughter Lives in: House/apartment Stairs:  2nd story and has a Theatre manager that isn't working  Has following equipment at home: None  PLOF: Independent; likes to golf   PATIENT GOALS: work on my balance  OBJECTIVE:    TODAY'S TREATMENT: 12/09/2023 Activity Comments  Reviewed HEP: Feet apart EC head turns/nods Feet together EO head turns/nods Alt steps taps  SLS  Cues for correct positioning in corner and UE support as needed, due to increased sway, LOB when not physically supported at corner  Wall bumps, 2 x 5 reps For hip strategy work for balance  Sidestep together R/L 2 x 10 reps Added green band for 2nd set  Stagger stance forward/back rocking 10 reps   Gait training with small quad tip cane, 120 ft, and SPC 60 ft Demo, visual, verbal, tactile cues for sequence (pt tends to use cane in RUE and with RLE, causing pt to get off balance)      HOME EXERCISE PROGRAM: Access Code: [email or web address] URL: [email or web address] Date: 12/09/2023 Prepared by: [facility] - Outpatient  Rehab - [facility]  Exercises - Corner Balance Feet Apart: Eyes Closed With Head Turns  - 1 x daily - 7 x weekly - 3 sets - 5 reps - Corner Balance Feet Together: Eyes Open With Head Turns  - 1 x daily - 7 x weekly - 3 sets - 5 reps - Alternating Step Taps with Counter Support  - 1 x daily - 7 x weekly - 2 sets - 10 reps - Single Leg Stance with Support  - 1 x daily - 7 x weekly - 1 sets - 3 reps - 10 sec hold - Side Stepping with Resistance at Ankles and Counter  Support  - 1 x daily - 3 x weekly - 2 sets - 10 reps  PATIENT EDUCATION: Education details:Answered pt's questions about POC-targeting back/pinched nerve versus balance (discussed that we will review chart more fully and address back as needed; however, referral and pt's goal are targeting neuropathy/balance); added to HEP Person educated: Patient Education method: Explanation, Demonstration, and Handouts Education comprehension: verbalized understanding, returned demonstration, and needs further education   ------------------------------------------------------------------- Note: Objective measures were completed at Evaluation unless otherwise noted.  DIAGNOSTIC FINDINGS: 09/20/23 lumbar MRI: At L5-S1, severe left and moderate right neural foraminal narrowing. 2. At L4-L5, left eccentric disc bulge and facet arthropathy results in displacement of the traversing left L5 nerve root in the subarticular zone and moderate bilateral neural foraminal narrowing.  COGNITION: Overall cognitive status: Within functional limits for tasks assessed   SENSATION: Pt denies N/T in B LEs   COORDINATION: Alternating pronation/supination: slightly dysmetria B  Alternating toe tap: intact B    POSTURE: rounded shoulders and forward head *pt wears foot up brace on L foot  LOWER EXTREMITY MMT:    MMT Right Eval Left Eval  Hip flexion 4+ 4+  Hip extension    Hip abduction 4 4  Hip adduction 4 4  Hip internal rotation    Hip external rotation    Knee flexion 4 4+  Knee extension 4 4+  Ankle dorsiflexion 3 2  Ankle plantarflexion 3+ 2+  Ankle inversion    Ankle eversion    (Blank rows = not tested)  GAIT: Wide BOS and B foot slap; pt very unsteady and reaches for items in gym for support    FUNCTIONAL TESTS:  5 times sit to stand: 10.34 sec without UEs with posterior LOB into chair 10 meter walk test: 15.56 sec without AD (2.78ft.sec) Dynamic Gait Index: 11/24 *pt struggles to maintain  standing balance without  holding onto therapist                                                                                                                                TREATMENT DATE: 11/26/23   Gait training with quad tip cane with and without counter top support or HHA and cueing for proper sequencing. Pt very unsteady and requires consistent cueing for sequencing.    PATIENT EDUCATION: Education details: prognosis, POC, edu on benefits of PT for balance and stressed need for consistent HEP compliance once HEP is initiated, edu on impairments and need for AD use d/t fall risk  Person educated: Patient Education method: Explanation, Demonstration, Tactile cues, and Verbal cues Education comprehension: verbalized understanding  HOME EXERCISE PROGRAM: Not initiated d/t time   GOALS: Goals reviewed with patient? Yes  SHORT TERM GOALS: Target date: 12/17/2023  Patient to be independent with initial HEP. Baseline: HEP initiated Goal status: INITIAL    LONG TERM GOALS: Target date: 01/07/2024  Patient to be independent with advanced HEP. Baseline: Not yet initiated  Goal status: INITIAL  Patient to score at least 16/24 on DGI in order to decrease risk of falls.  Baseline: 11/24 Goal status: INITIAL  Patient to demonstrate 5xSTS test in <15 sec without posterior lean or LOB in order to decrease risk of falls.  Baseline: 10.34 sec without UEs with posterior LOB into chair Goal status: INITIAL  Patient to demonstrate consistent use of LRAD for improved safety with gait.  Baseline: does not own AD Goal status: INITIAL  ASSESSMENT:  CLINICAL IMPRESSION: Pt presents today with no new complaints. Skilled PT session focused on review of HEP and progression of balance exercises. Pt needs cues for correct positioning in corner for HEP, to avoid leaning to corner but to be balanced and safe.  Worked on hip strategy and hip strength to help with balance. Pt will continue to  benefit from skilled PT towards goals for improved functional mobility and decreased fall risk.   OBJECTIVE IMPAIRMENTS: Abnormal gait, decreased activity tolerance, decreased balance, decreased coordination, decreased knowledge of use of DME, difficulty walking, decreased ROM, decreased strength, and postural dysfunction.   ACTIVITY LIMITATIONS: carrying, lifting, bending, standing, squatting, stairs, transfers, bathing, toileting, dressing, reach over head, hygiene/grooming, locomotion level, and caring for others  PARTICIPATION LIMITATIONS: meal prep, cleaning, laundry, shopping, community activity, yard work, and church  PERSONAL FACTORS: Age, Past/current experiences, Time since onset of injury/illness/exacerbation, and 3+ comorbidities: COPD, HTN, hypothyroidism, HLD, CAD, OSA on CPAP  are also affecting patient's functional outcome.   REHAB POTENTIAL: Good  CLINICAL DECISION MAKING: Evolving/moderate complexity  EVALUATION COMPLEXITY: Moderate  PLAN:  PT FREQUENCY: 1x/week  PT DURATION: 6 weeks  PLANNED INTERVENTIONS: [id number]- PT Re-evaluation, 97750- Physical Performance Testing, 97110-Therapeutic exercises, 97530- Therapeutic activity, [id number]- Neuromuscular re-education, 97535- Self Care, [id number]- Manual therapy, [id number]- Gait training, [phone]- Orthotic Initial, [phone]- Canalith repositioning, [id number]- Aquatic Therapy, [id number]- Electrical stimulation (unattended), [id number]- Electrical stimulation (  manual), Patient/Family education, Balance training, Stair training, Taping, Dry Needling, Joint mobilization, Spinal mobilization, Vestibular training, DME instructions, Cryotherapy, and Moist heat  PLAN FOR NEXT SESSION: Do we need to focus any on the back/pinched nerve (per pt questions); Review and progress HEP for static and dynamic balance, continue gait training with quad tip cane/SPC vs. Trekking pole; work on Engineer, building services and edu on safety in the home    Dessie Flow, PT 12/09/23 9:30  AM Phone: [phone] Fax: [phone]   [facility] at [facility] [address], [address] [place], [place] [address] Phone # [phone] Fax # [phone]

## 2023-12-17 ENCOUNTER — Ambulatory Visit: Admitting: Physical Therapy

## 2023-12-23 ENCOUNTER — Ambulatory Visit

## 2023-12-26 DIAGNOSIS — L400 Psoriasis vulgaris: Secondary | ICD-10-CM | POA: Diagnosis not present

## 2023-12-26 DIAGNOSIS — Z79899 Other long term (current) drug therapy: Secondary | ICD-10-CM | POA: Diagnosis not present

## 2023-12-29 ENCOUNTER — Ambulatory Visit: Admitting: Physical Therapy

## 2023-12-30 DIAGNOSIS — H524 Presbyopia: Secondary | ICD-10-CM | POA: Diagnosis not present

## 2023-12-30 DIAGNOSIS — H5203 Hypermetropia, bilateral: Secondary | ICD-10-CM | POA: Diagnosis not present

## 2023-12-30 DIAGNOSIS — H52209 Unspecified astigmatism, unspecified eye: Secondary | ICD-10-CM | POA: Diagnosis not present

## 2023-12-30 DIAGNOSIS — H2512 Age-related nuclear cataract, left eye: Secondary | ICD-10-CM | POA: Diagnosis not present

## 2024-01-02 DIAGNOSIS — E039 Hypothyroidism, unspecified: Secondary | ICD-10-CM | POA: Diagnosis not present

## 2024-01-07 ENCOUNTER — Ambulatory Visit: Admitting: Physical Therapy

## 2024-01-14 ENCOUNTER — Ambulatory Visit
Admission: RE | Admit: 2024-01-14 | Discharge: 2024-01-14 | Disposition: A | Discharge status: Home / Self Care | Source: Ambulatory Visit | Attending: Nurse Practitioner | Admitting: Nurse Practitioner

## 2024-01-14 DIAGNOSIS — R911 Solitary pulmonary nodule: Secondary | ICD-10-CM

## 2024-01-14 DIAGNOSIS — J439 Emphysema, unspecified: Secondary | ICD-10-CM | POA: Diagnosis not present

## 2024-01-14 DIAGNOSIS — R918 Other nonspecific abnormal finding of lung field: Secondary | ICD-10-CM | POA: Diagnosis not present

## 2024-01-14 DIAGNOSIS — I2510 Atherosclerotic heart disease of native coronary artery without angina pectoris: Secondary | ICD-10-CM | POA: Diagnosis not present

## 2024-01-14 DIAGNOSIS — K8020 Calculus of gallbladder without cholecystitis without obstruction: Secondary | ICD-10-CM | POA: Diagnosis not present

## 2024-01-23 ENCOUNTER — Ambulatory Visit: Payer: Self-pay | Admitting: Nurse Practitioner

## 2024-01-26 NOTE — Progress Notes (Signed)
 I spoke with the pt and notified of results. He verbalized understanding.

## 2024-02-25 ENCOUNTER — Encounter: Payer: Self-pay | Admitting: Pulmonary Disease

## 2024-02-25 ENCOUNTER — Ambulatory Visit (INDEPENDENT_AMBULATORY_CARE_PROVIDER_SITE_OTHER): Admitting: Pulmonary Disease

## 2024-02-25 ENCOUNTER — Ambulatory Visit: Admitting: Pulmonary Disease

## 2024-02-25 VITALS — BP 101/56 | HR 66 | Ht 67.0 in | Wt 163.1 lb

## 2024-02-25 DIAGNOSIS — J449 Chronic obstructive pulmonary disease, unspecified: Secondary | ICD-10-CM | POA: Diagnosis not present

## 2024-02-25 DIAGNOSIS — Z87891 Personal history of nicotine dependence: Secondary | ICD-10-CM | POA: Diagnosis not present

## 2024-02-25 LAB — PULMONARY FUNCTION TEST
DL/VA % pred: 65 %
DL/VA: 2.59 ml/min/mmHg/L
DLCO cor % pred: 65 %
DLCO cor: 14.6 ml/min/mmHg
DLCO unc % pred: 65 %
DLCO unc: 14.6 ml/min/mmHg
FEF 25-75 Post: 0.62 L/s
FEF 25-75 Pre: 0.52 L/s
FEF2575-%Change-Post: 19 %
FEF2575-%Pred-Post: 36 %
FEF2575-%Pred-Pre: 30 %
FEV1-%Change-Post: 9 %
FEV1-%Pred-Post: 52 %
FEV1-%Pred-Pre: 47 %
FEV1-Post: 1.31 L
FEV1-Pre: 1.19 L
FEV1FVC-%Change-Post: -2 %
FEV1FVC-%Pred-Pre: 68 %
FEV6-%Change-Post: 7 %
FEV6-%Pred-Post: 79 %
FEV6-%Pred-Pre: 73 %
FEV6-Post: 2.6 L
FEV6-Pre: 2.41 L
FEV6FVC-%Change-Post: -4 %
FEV6FVC-%Pred-Post: 102 %
FEV6FVC-%Pred-Pre: 107 %
FVC-%Change-Post: 12 %
FVC-%Pred-Post: 77 %
FVC-%Pred-Pre: 68 %
FVC-Post: 2.73 L
FVC-Pre: 2.43 L
Post FEV1/FVC ratio: 48 %
Post FEV6/FVC ratio: 95 %
Pre FEV1/FVC ratio: 49 %
Pre FEV6/FVC Ratio: 100 %
RV % pred: 221 %
RV: 5.53 L
TLC % pred: 134 %
TLC: 8.67 L

## 2024-02-25 MED ORDER — BREZTRI AEROSPHERE 160-9-4.8 MCG/ACT IN AERO: 2.0000 | INHALATION_SPRAY | Freq: Two times a day (BID) | RESPIRATORY_TRACT | 11 refills | Status: AC

## 2024-02-25 NOTE — Patient Instructions (Signed)
 Full PFT performed today.

## 2024-02-25 NOTE — Progress Notes (Signed)
 Full PFT performed today.

## 2024-02-25 NOTE — Progress Notes (Signed)
 Synopsis: Hx of COPD, OSA and lung nodules  Subjective:   PATIENT ID: Harry Lucas GENDER: male DOB: 06/23/1946, MRN: [id number]  HPI  Chief Complaint  Patient presents with   Follow-up    PFT   Harry Lucas is an 78 year old male, former smoker with history of COPD, OSA and lung nodules who returns to pulmonary clinic for follow up.   He was last seen 12/02/23 by Izetta Rouleau, NP.   He uses Breztri  as a maintenance inhaler, two puffs in the morning and evening. Albuterol  is used occasionally, such as during a recent golf game, but not daily.   PFTs today show moderately severe obstruction with air trapping and mild diffusion defect. Stable from 2019.  He has a smoking history of 55 to 60 years and has quit in 2020. He was exposed to secondhand smoke during his upbringing. 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  He has sleep apnea and uses a CPAP machine since 1998. Recently, he cannot use the CPAP for more than two to three hours at night. The CPAP machine is old.  Past Medical History:  Diagnosis Date   Coronary artery disease    Eczema      Family History  Problem Relation Age of Onset   Thyroid  disease Mother    Heart block Father    Hypertension Other      Social History   Socioeconomic History   Marital status: Married    Spouse name: Not on file   Number of children: Not on file   Years of education: Not on file   Highest education level: Not on file  Occupational History   Not on file  Tobacco Use   Smoking status: Former    Current packs/day: 0.00    Types: Cigarettes    Quit date: 07/13/2019    Years since quitting: 4.6    Passive exposure: Past   Smokeless tobacco: Never  Vaping Use   Vaping status: Never Used  Substance and Sexual Activity   Alcohol  use: Not Currently    Comment: occas   Drug use: Never   Sexual activity: Not on file  Other Topics Concern    Not on file  Social History Narrative   Are you right handed or left handed? Right   Are you currently employed ?    What is your current occupation? retired   Do you live at home alone?   Who lives with you?family    What type of home do you live in: 1 story or 2 story? two    Caffiene 4 or 5  cups a day   Social Drivers of Corporate investment banker Strain: Not on file  Food Insecurity: Not on file  Transportation Needs: Not on file  Physical Activity: Not on file  Stress: Not on file  Social Connections: Not on file  Intimate Partner Violence: Not on file     No Known Allergies   Outpatient Medications Prior to Visit  Medication Sig Dispense Refill   acetaminophen  (TYLENOL ) 500 MG tablet Take 1,000 mg by mouth every 6 (six) hours as needed for headache (pain).     albuterol  (PROVENTIL ) (2.5 MG/3ML) 0.083% nebulizer solution Take 3 mLs (2.5 mg total) by nebulization every 6 (six) hours as needed for wheezing or shortness of breath. 75 mL 12   albuterol  (VENTOLIN  HFA) 108 (90 Base) MCG/ACT inhaler  Inhale 1-2 puffs into the lungs every 6 (six) hours as needed for wheezing or shortness of breath. 1 each 11   azelastine  (ASTELIN ) 0.1 % nasal spray Place 2 sprays into both nostrils 2 (two) times daily. Use in each nostril as directed 30 mL 2   betamethasone dipropionate 0.05 % lotion Apply 1 application topically daily as needed (psoriasis/eczema).     Budeson-Glycopyrrol-Formoterol  (BREZTRI  AEROSPHERE) 160-9-4.8 MCG/ACT AERO Inhale 2 puffs into the lungs in the morning and at bedtime. 10.7 g 11   budeson-glycopyrrolate-formoterol  (BREZTRI  AEROSPHERE) 160-9-4.8 MCG/ACT AERO inhaler Inhale 2 puffs into the lungs in the morning and at bedtime.     cholecalciferol (VITAMIN D) 25 MCG (1000 UNIT) tablet Take 1,000 Units by mouth every morning.     FLUoxetine (PROZAC) 40 MG capsule Take 40 mg by mouth every morning.     fluticasone  (FLONASE ) 50 MCG/ACT nasal spray Place 2 sprays into both  nostrils daily. 18.2 mL 2   folic acid (FOLVITE) 1 MG tablet Take 1 mg by mouth every morning.     hydrocortisone 2.5 % cream Apply 1 application topically 2 (two) times daily as needed (psoriasis/eczema).     levothyroxine (SYNTHROID) 175 MCG tablet Take 150 mcg by mouth daily before breakfast.     loratadine  (CLARITIN ) 10 MG tablet Take 1 tablet (10 mg total) by mouth daily. 30 tablet 5   losartan (COZAAR) 25 MG tablet Take 25 mg by mouth daily.     magnesium oxide (MAG-OX) 400 (240 Mg) MG tablet Take 400 mg by mouth daily.     methotrexate  (RHEUMATREX) 2.5 MG tablet Take 6 tablets (15 mg total) by mouth every Sunday. (Patient taking differently: Take 15 mg by mouth every Sunday. 5 tabs every Sunday) 4 tablet 0   montelukast  (SINGULAIR ) 10 MG tablet Take 1 tablet (10 mg total) by mouth every morning. 90 tablet 3   simvastatin  (ZOCOR ) 40 MG tablet Take 40 mg by mouth every morning.     Spacer/Aero-Holding Chambers DEVI 2 puffs by Does not apply route 2 (two) times daily. Use with inhaler twice daily 1 each 0   vitamin B-12 (CYANOCOBALAMIN ) 1000 MCG tablet Take 1,000 mcg by mouth every morning.     Glycopyrrolate-Formoterol  (BEVESPI  AEROSPHERE) 9-4.8 MCG/ACT AERO Inhale 2 puffs into the lungs 2 (two) times daily. (Patient not taking: Reported on 02/25/2024) 10.7 g 11   No facility-administered medications prior to visit.    Review of Systems  Constitutional:  Negative for chills, fever, malaise/fatigue and weight loss.  HENT:  Negative for congestion, sinus pain and sore throat.   Eyes: Negative.   Respiratory:  Negative for cough, hemoptysis, sputum production, shortness of breath and wheezing.   Cardiovascular:  Negative for chest pain, palpitations, orthopnea, claudication and leg swelling.  Gastrointestinal:  Negative for abdominal pain, heartburn, nausea and vomiting.  Genitourinary: Negative.   Musculoskeletal:  Negative for joint pain and myalgias.  Skin:  Negative for rash.   Neurological:  Negative for weakness.  Endo/Heme/Allergies: Negative.   Psychiatric/Behavioral: Negative.      Objective:   Vitals:   02/25/24 1406  BP: (!) 101/56  Pulse: 66  SpO2: 97%  Weight: 163 lb 1.6 oz (74 kg)  Height: 5' 7 (1.702 m)   Physical Exam Constitutional:      General: He is not in acute distress.    Appearance: Normal appearance.  Eyes:     General: No scleral icterus.    Conjunctiva/sclera: Conjunctivae normal.  Cardiovascular:  Rate and Rhythm: Normal rate and regular rhythm.  Pulmonary:     Breath sounds: No wheezing, rhonchi or rales.  Musculoskeletal:     Right lower leg: No edema.     Left lower leg: No edema.  Skin:    General: Skin is warm and dry.  Neurological:     General: No focal deficit present.    CBC    Component Value Date/Time   WBC 15.3 (H) 08/12/2021 0430   RBC 3.44 (L) 08/12/2021 0430   HGB 11.5 (L) 08/12/2021 0430   HCT 34.9 (L) 08/12/2021 0430   PLT 246 08/12/2021 0430   MCV 101.5 (H) 08/12/2021 0430   MCH 33.4 08/12/2021 0430   MCHC 33.0 08/12/2021 0430   RDW 13.3 08/12/2021 0430   LYMPHSABS 0.2 (L) 08/12/2021 0430   MONOABS 0.4 08/12/2021 0430   EOSABS 0.0 08/12/2021 0430   BASOSABS 0.0 08/12/2021 0430      Latest Ref Rng & Units 08/12/2021    4:30 AM 08/11/2021    5:45 PM  BMP  Glucose 70 - 99 mg/dL 844  886   BUN 8 - 23 mg/dL 20  22   Creatinine 9.38 - 1.24 mg/dL 8.42  8.16   Sodium 864 - 145 mmol/L 136  132   Potassium 3.5 - 5.1 mmol/L 4.3  3.9   Chloride 98 - 111 mmol/L 104  100   CO2 22 - 32 mmol/L 22  24   Calcium 8.9 - 10.3 mg/dL 8.9  8.8    Chest imaging: CT Chest 01/14/24 1. Emphysema with diffuse bronchial thickening and occasional subsegmental bronchial plugs in the left lower lobe. 2. Waxing and waning clustered micronodular disease in the left lower lobe and lingula, with decreased micronodularity in the medial lingular base and medial right middle lobe base. Findings are probably due to  a chronic infectious or inflammatory process including atypical mycobacterial disease. 3. Stable small subcentimeter solid nodules in both lungs. 4. Aortic and coronary artery atherosclerosis. 5. Cholelithiasis. 6. Osteopenia and degenerative change.  PFT:    Latest Ref Rng & Units 02/25/2024   11:42 AM 11/19/2017   11:24 AM  PFT Results  FVC-Pre L 2.43  P 2.16   FVC-Predicted Pre % 68  P 57   FVC-Post L 2.73  P 3.09   FVC-Predicted Post % 77  P 81   Pre FEV1/FVC % % 49  P 43   Post FEV1/FCV % % 48  P 40   FEV1-Pre L 1.19  P 0.93   FEV1-Predicted Pre % 47  P 34   FEV1-Post L 1.31  P 1.23   DLCO uncorrected ml/min/mmHg 14.60  P 15.25   DLCO UNC% % 65  P 53   DLCO corrected ml/min/mmHg 14.60  P   DLCO COR %Predicted % 65  P   DLVA Predicted % 65  P 82   TLC L 8.67  P 7.55   TLC % Predicted % 134  P 117   RV % Predicted % 221  P 221     P Preliminary result  PFT 2019 Severe obstructive airways disease with significant bronchodilator response   Labs:  Path:  Echo:  Heart Catheterization:    Assessment & Plan:   Chronic obstructive pulmonary disease, unspecified COPD type (HCC)  Discussion: Xue Low is an 78 year old male, former smoker with history of COPD, OSA and lung nodules who returns to pulmonary clinic for follow up.   Chronic Obstructive Pulmonary  Disease (COPD) Moderately severe obstruction with air trapping and mild diffusion defect.  - Continue Breztri , 2 puffs morning and evening. - Encourage regular physical activity. - Monitor albuterol  use and adjust maintenance if needed.  Sleep Apnea Long-standing sleep apnea managed with CPAP. Difficulty using CPAP throughout the night. Current CPAP machine is reportedly very old. - Order home sleep study to reassess. - Arrange follow-up with nurse practitioner in 2 months. - Consider new CPAP machine based on study results.  Follow up in 1 year for COPD and in 2 months with APP for CPAP  needs.  Dorn Chill, MD [facility] Office: [phone]   Current Outpatient Medications:    acetaminophen  (TYLENOL ) 500 MG tablet, Take 1,000 mg by mouth every 6 (six) hours as needed for headache (pain)., Disp: , Rfl:    albuterol  (PROVENTIL ) (2.5 MG/3ML) 0.083% nebulizer solution, Take 3 mLs (2.5 mg total) by nebulization every 6 (six) hours as needed for wheezing or shortness of breath., Disp: 75 mL, Rfl: 12   albuterol  (VENTOLIN  HFA) 108 (90 Base) MCG/ACT inhaler, Inhale 1-2 puffs into the lungs every 6 (six) hours as needed for wheezing or shortness of breath., Disp: 1 each, Rfl: 11   azelastine  (ASTELIN ) 0.1 % nasal spray, Place 2 sprays into both nostrils 2 (two) times daily. Use in each nostril as directed, Disp: 30 mL, Rfl: 2   betamethasone dipropionate 0.05 % lotion, Apply 1 application topically daily as needed (psoriasis/eczema)., Disp: , Rfl:    Budeson-Glycopyrrol-Formoterol  (BREZTRI  AEROSPHERE) 160-9-4.8 MCG/ACT AERO, Inhale 2 puffs into the lungs in the morning and at bedtime., Disp: 10.7 g, Rfl: 11   budeson-glycopyrrolate-formoterol  (BREZTRI  AEROSPHERE) 160-9-4.8 MCG/ACT AERO inhaler, Inhale 2 puffs into the lungs in the morning and at bedtime., Disp: , Rfl:    cholecalciferol (VITAMIN D) 25 MCG (1000 UNIT) tablet, Take 1,000 Units by mouth every morning., Disp: , Rfl:    FLUoxetine (PROZAC) 40 MG capsule, Take 40 mg by mouth every morning., Disp: , Rfl:    fluticasone  (FLONASE ) 50 MCG/ACT nasal spray, Place 2 sprays into both nostrils daily., Disp: 18.2 mL, Rfl: 2   folic acid (FOLVITE) 1 MG tablet, Take 1 mg by mouth every morning., Disp: , Rfl:    hydrocortisone 2.5 % cream, Apply 1 application topically 2 (two) times daily as needed (psoriasis/eczema)., Disp: , Rfl:    levothyroxine (SYNTHROID) 175 MCG tablet, Take 150 mcg by mouth daily before breakfast., Disp: , Rfl:    loratadine  (CLARITIN ) 10 MG tablet, Take 1 tablet (10 mg total) by mouth  daily., Disp: 30 tablet, Rfl: 5   losartan (COZAAR) 25 MG tablet, Take 25 mg by mouth daily., Disp: , Rfl:    magnesium oxide (MAG-OX) 400 (240 Mg) MG tablet, Take 400 mg by mouth daily., Disp: , Rfl:    methotrexate  (RHEUMATREX) 2.5 MG tablet, Take 6 tablets (15 mg total) by mouth every Sunday. (Patient taking differently: Take 15 mg by mouth every Sunday. 5 tabs every Sunday), Disp: 4 tablet, Rfl: 0   montelukast  (SINGULAIR ) 10 MG tablet, Take 1 tablet (10 mg total) by mouth every morning., Disp: 90 tablet, Rfl: 3   simvastatin  (ZOCOR ) 40 MG tablet, Take 40 mg by mouth every morning., Disp: , Rfl:    Spacer/Aero-Holding Chambers DEVI, 2 puffs by Does not apply route 2 (two) times daily. Use with inhaler twice daily, Disp: 1 each, Rfl: 0   vitamin B-12 (CYANOCOBALAMIN ) 1000 MCG tablet, Take 1,000 mcg by mouth every  morning., Disp: , Rfl:    Glycopyrrolate-Formoterol  (BEVESPI  AEROSPHERE) 9-4.8 MCG/ACT AERO, Inhale 2 puffs into the lungs 2 (two) times daily. (Patient not taking: Reported on 02/25/2024), Disp: 10.7 g, Rfl: 11

## 2024-02-25 NOTE — Patient Instructions (Addendum)
 Continue breztri  inhaler inhaler 2 puffs twice daily - rinse mouth out after each use  Continue to use albuterol  inhaler 1-2 puffs every 4-6 hours as needed  We will schedule you for a home sleep study to get you a new machine  Follow up with nurse practitioner in 2 months to review your CPAP machine needs  Follow up with Dr. Kara in 1 year, call sooner if needed

## 2024-03-07 ENCOUNTER — Encounter

## 2024-03-07 DIAGNOSIS — G4730 Sleep apnea, unspecified: Secondary | ICD-10-CM | POA: Diagnosis not present

## 2024-03-07 DIAGNOSIS — J449 Chronic obstructive pulmonary disease, unspecified: Secondary | ICD-10-CM

## 2024-03-22 ENCOUNTER — Telehealth: Payer: Self-pay

## 2024-03-22 NOTE — Telephone Encounter (Signed)
 Copied from CRM #[id number]. Topic: Clinical - Lab/Test Results >> Mar 22, 2024  8:45 AM Joesph PARAS wrote: Reason for CRM: Patient states mailed in sleep study two weeks ago and has not heard about results. No results present in chart. Patient requesting update.

## 2024-03-22 NOTE — Telephone Encounter (Signed)
 Routing to [facility] to advise this.

## 2024-03-22 NOTE — Telephone Encounter (Signed)
 [employer] Website states it is pending Dr. Saundra signature

## 2024-03-25 NOTE — Telephone Encounter (Signed)
 I spoke with the pt and explained to him that his sleep study has not been read, and that once it has been reviewed we will contact him with the results. Pt expressed his dissatisfaction with this reply and abruptly hung up the phone.   Please advise on the sleep study, thanks

## 2024-03-26 DIAGNOSIS — G4733 Obstructive sleep apnea (adult) (pediatric): Secondary | ICD-10-CM | POA: Diagnosis not present

## 2024-03-29 NOTE — Telephone Encounter (Signed)
 Harry Reggy BIRCH, MD to Me     03/29/24 10:47 AM I can't tell if this study has been done. Do you know if it was SNAP or another, and when?

## 2024-04-01 ENCOUNTER — Ambulatory Visit (INDEPENDENT_AMBULATORY_CARE_PROVIDER_SITE_OTHER): Admitting: Pulmonary Disease

## 2024-04-01 ENCOUNTER — Encounter: Payer: Self-pay | Admitting: Pulmonary Disease

## 2024-04-01 ENCOUNTER — Other Ambulatory Visit: Payer: Self-pay

## 2024-04-01 VITALS — BP 102/58 | HR 63 | Ht 67.0 in | Wt 158.2 lb

## 2024-04-01 DIAGNOSIS — G4733 Obstructive sleep apnea (adult) (pediatric): Secondary | ICD-10-CM

## 2024-04-01 NOTE — Progress Notes (Signed)
 Synopsis: Hx of COPD, OSA and lung nodules  Subjective:   PATIENT ID: Harry Lucas GENDER: male DOB: 03-28-1946, MRN: [id number]  HPI  Chief Complaint  Patient presents with   Medical Management of Chronic Issues    HST f/u   Harry Lucas is an 78 year old male, former smoker with history of COPD, OSA and lung nodules who returns to pulmonary clinic for follow up.   He was last seen 12/02/23 by Izetta Rouleau, NP.   He uses Breztri  as a maintenance inhaler, two puffs in the morning and evening. Albuterol  is used occasionally, such as during a recent golf game, but not daily.   PFTs today show moderately severe obstruction with air trapping and mild diffusion defect. Stable from 2019.  He has a smoking history of 55 to 60 years and has quit in 2020. He was exposed to secondhand smoke during his upbringing. 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  He has sleep apnea and uses a CPAP machine since 1998. Recently, he cannot use the CPAP for more than two to three hours at night. The CPAP machine is old.  OV 04/01/24 Home sleep study shows mild OSA with AHI 8.3/hr. He continues to remain very fatigued and tired during the day. Discussed ordering new cpap machine and supplies and he is amenable to this.  Past Medical History:  Diagnosis Date   Coronary artery disease    Eczema      Family History  Problem Relation Age of Onset   Thyroid  disease Mother    Heart block Father    Hypertension Other      Social History   Socioeconomic History   Marital status: Married    Spouse name: Not on file   Number of children: Not on file   Years of education: Not on file   Highest education level: Not on file  Occupational History   Not on file  Tobacco Use   Smoking status: Former    Current packs/day: 0.00    Types: Cigarettes    Quit date: 07/13/2019    Years since quitting: 4.7    Passive exposure:  Past   Smokeless tobacco: Never  Vaping Use   Vaping status: Never Used  Substance and Sexual Activity   Alcohol  use: Not Currently    Comment: occas   Drug use: Never   Sexual activity: Not on file  Other Topics Concern   Not on file  Social History Narrative   Are you right handed or left handed? Right   Are you currently employed ?    What is your current occupation? retired   Do you live at home alone?   Who lives with you?family    What type of home do you live in: 1 story or 2 story? two    Caffiene 4 or 5  cups a day   Social Drivers of Corporate investment banker Strain: Not on file  Food Insecurity: Not on file  Transportation Needs: Not on file  Physical Activity: Not on file  Stress: Not on file  Social Connections: Not on file  Intimate Partner Violence: Not on file     No Known Allergies   Outpatient Medications Prior to Visit  Medication Sig Dispense Refill   acetaminophen  (TYLENOL ) 500 MG tablet Take 1,000 mg by mouth every 6 (six) hours as needed for headache (pain).  albuterol  (PROVENTIL ) (2.5 MG/3ML) 0.083% nebulizer solution Take 3 mLs (2.5 mg total) by nebulization every 6 (six) hours as needed for wheezing or shortness of breath. 75 mL 12   albuterol  (VENTOLIN  HFA) 108 (90 Base) MCG/ACT inhaler Inhale 1-2 puffs into the lungs every 6 (six) hours as needed for wheezing or shortness of breath. 1 each 11   azelastine  (ASTELIN ) 0.1 % nasal spray Place 2 sprays into both nostrils 2 (two) times daily. Use in each nostril as directed 30 mL 2   betamethasone dipropionate 0.05 % lotion Apply 1 application topically daily as needed (psoriasis/eczema).     budesonide-glycopyrrolate-formoterol  (BREZTRI  AEROSPHERE) 160-9-4.8 MCG/ACT AERO inhaler Inhale 2 puffs into the lungs in the morning and at bedtime. 10.7 g 11   cholecalciferol (VITAMIN D) 25 MCG (1000 UNIT) tablet Take 1,000 Units by mouth every morning.     FLUoxetine (PROZAC) 40 MG capsule Take 40 mg by  mouth every morning.     fluticasone  (FLONASE ) 50 MCG/ACT nasal spray Place 2 sprays into both nostrils daily. 18.2 mL 2   folic acid (FOLVITE) 1 MG tablet Take 1 mg by mouth every morning.     hydrocortisone 2.5 % cream Apply 1 application topically 2 (two) times daily as needed (psoriasis/eczema).     levothyroxine (SYNTHROID) 175 MCG tablet Take 150 mcg by mouth daily before breakfast.     loratadine  (CLARITIN ) 10 MG tablet Take 1 tablet (10 mg total) by mouth daily. 30 tablet 5   losartan (COZAAR) 25 MG tablet Take 25 mg by mouth daily.     magnesium oxide (MAG-OX) 400 (240 Mg) MG tablet Take 400 mg by mouth daily.     methotrexate  (RHEUMATREX) 2.5 MG tablet Take 6 tablets (15 mg total) by mouth every Sunday. (Patient taking differently: Take 15 mg by mouth every Sunday. 5 tabs every Sunday) 4 tablet 0   montelukast  (SINGULAIR ) 10 MG tablet Take 1 tablet (10 mg total) by mouth every morning. 90 tablet 3   simvastatin  (ZOCOR ) 40 MG tablet Take 40 mg by mouth every morning.     Spacer/Aero-Holding Chambers DEVI 2 puffs by Does not apply route 2 (two) times daily. Use with inhaler twice daily 1 each 0   vitamin B-12 (CYANOCOBALAMIN ) 1000 MCG tablet Take 1,000 mcg by mouth every morning.     No facility-administered medications prior to visit.    Review of Systems  Constitutional:  Positive for malaise/fatigue. Negative for chills, fever and weight loss.  HENT:  Negative for congestion, sinus pain and sore throat.   Eyes: Negative.   Respiratory:  Negative for cough, hemoptysis, sputum production, shortness of breath and wheezing.   Cardiovascular:  Negative for chest pain, palpitations, orthopnea, claudication and leg swelling.  Gastrointestinal:  Negative for abdominal pain, heartburn, nausea and vomiting.  Genitourinary: Negative.   Musculoskeletal:  Negative for joint pain and myalgias.  Skin:  Negative for rash.  Neurological:  Negative for weakness.  Endo/Heme/Allergies: Negative.    Psychiatric/Behavioral: Negative.      Objective:   Vitals:   04/01/24 1253  BP: (!) 102/58  Pulse: 63  SpO2: 95%  Weight: 158 lb 3.2 oz (71.8 kg)  Height: 5' 7 (1.702 m)    Physical Exam Constitutional:      General: He is not in acute distress.    Appearance: Normal appearance.  Eyes:     General: No scleral icterus.    Conjunctiva/sclera: Conjunctivae normal.  Cardiovascular:     Rate and Rhythm:  Normal rate and regular rhythm.  Pulmonary:     Breath sounds: No wheezing, rhonchi or rales.  Musculoskeletal:     Right lower leg: No edema.     Left lower leg: No edema.  Skin:    General: Skin is warm and dry.  Neurological:     General: No focal deficit present.    CBC    Component Value Date/Time   WBC 15.3 (H) 08/12/2021 0430   RBC 3.44 (L) 08/12/2021 0430   HGB 11.5 (L) 08/12/2021 0430   HCT 34.9 (L) 08/12/2021 0430   PLT 246 08/12/2021 0430   MCV 101.5 (H) 08/12/2021 0430   MCH 33.4 08/12/2021 0430   MCHC 33.0 08/12/2021 0430   RDW 13.3 08/12/2021 0430   LYMPHSABS 0.2 (L) 08/12/2021 0430   MONOABS 0.4 08/12/2021 0430   EOSABS 0.0 08/12/2021 0430   BASOSABS 0.0 08/12/2021 0430      Latest Ref Rng & Units 08/12/2021    4:30 AM 08/11/2021    5:45 PM  BMP  Glucose 70 - 99 mg/dL 844  886   BUN 8 - 23 mg/dL 20  22   Creatinine 9.38 - 1.24 mg/dL 8.42  8.16   Sodium 864 - 145 mmol/L 136  132   Potassium 3.5 - 5.1 mmol/L 4.3  3.9   Chloride 98 - 111 mmol/L 104  100   CO2 22 - 32 mmol/L 22  24   Calcium 8.9 - 10.3 mg/dL 8.9  8.8    Chest imaging: CT Chest 01/14/24 1. Emphysema with diffuse bronchial thickening and occasional subsegmental bronchial plugs in the left lower lobe. 2. Waxing and waning clustered micronodular disease in the left lower lobe and lingula, with decreased micronodularity in the medial lingular base and medial right middle lobe base. Findings are probably due to a chronic infectious or inflammatory process including atypical  mycobacterial disease. 3. Stable small subcentimeter solid nodules in both lungs. 4. Aortic and coronary artery atherosclerosis. 5. Cholelithiasis. 6. Osteopenia and degenerative change.  PFT:    Latest Ref Rng & Units 02/25/2024   11:42 AM 11/19/2017   11:24 AM  PFT Results  FVC-Pre L 2.43  2.16   FVC-Predicted Pre % 68  57   FVC-Post L 2.73  3.09   FVC-Predicted Post % 77  81   Pre FEV1/FVC % % 49  43   Post FEV1/FCV % % 48  40   FEV1-Pre L 1.19  0.93   FEV1-Predicted Pre % 47  34   FEV1-Post L 1.31  1.23   DLCO uncorrected ml/min/mmHg 14.60  15.25   DLCO UNC% % 65  53   DLCO corrected ml/min/mmHg 14.60    DLCO COR %Predicted % 65    DLVA Predicted % 65  82   TLC L 8.67  7.55   TLC % Predicted % 134  117   RV % Predicted % 221  221   PFT 2019 Severe obstructive airways disease with significant bronchodilator response   Labs:  Path:  Echo:  Heart Catheterization:  Home Sleep Study 03/07/24 Mild obstructive sleep apnea with AHI 8.3/hr     Assessment & Plan:   OSA on CPAP  Discussion: Harry Lucas is an 78 year old male, former smoker with history of COPD, OSA and lung nodules who returns to pulmonary clinic for follow up.   Chronic Obstructive Pulmonary Disease (COPD) Moderately severe obstruction with air trapping and mild diffusion defect.  - Continue Breztri , 2 puffs  morning and evening. - Encourage regular physical activity. - Monitor albuterol  use and adjust maintenance if needed.  Sleep Apnea Home sleep study with AHI 8.3/hr - order new CPAP machine 5-15cmH20 with humidification with mask fitting and supply refills  Follow up in 4-6 weeks after starting new cpap machine  Dorn Chill, MD [facility] Office: [phone]   Current Outpatient Medications:    acetaminophen  (TYLENOL ) 500 MG tablet, Take 1,000 mg by mouth every 6 (six) hours as needed for headache (pain)., Disp: , Rfl:    albuterol  (PROVENTIL ) (2.5  MG/3ML) 0.083% nebulizer solution, Take 3 mLs (2.5 mg total) by nebulization every 6 (six) hours as needed for wheezing or shortness of breath., Disp: 75 mL, Rfl: 12   albuterol  (VENTOLIN  HFA) 108 (90 Base) MCG/ACT inhaler, Inhale 1-2 puffs into the lungs every 6 (six) hours as needed for wheezing or shortness of breath., Disp: 1 each, Rfl: 11   azelastine  (ASTELIN ) 0.1 % nasal spray, Place 2 sprays into both nostrils 2 (two) times daily. Use in each nostril as directed, Disp: 30 mL, Rfl: 2   betamethasone dipropionate 0.05 % lotion, Apply 1 application topically daily as needed (psoriasis/eczema)., Disp: , Rfl:    budesonide-glycopyrrolate-formoterol  (BREZTRI  AEROSPHERE) 160-9-4.8 MCG/ACT AERO inhaler, Inhale 2 puffs into the lungs in the morning and at bedtime., Disp: 10.7 g, Rfl: 11   cholecalciferol (VITAMIN D) 25 MCG (1000 UNIT) tablet, Take 1,000 Units by mouth every morning., Disp: , Rfl:    FLUoxetine (PROZAC) 40 MG capsule, Take 40 mg by mouth every morning., Disp: , Rfl:    fluticasone  (FLONASE ) 50 MCG/ACT nasal spray, Place 2 sprays into both nostrils daily., Disp: 18.2 mL, Rfl: 2   folic acid (FOLVITE) 1 MG tablet, Take 1 mg by mouth every morning., Disp: , Rfl:    hydrocortisone 2.5 % cream, Apply 1 application topically 2 (two) times daily as needed (psoriasis/eczema)., Disp: , Rfl:    levothyroxine (SYNTHROID) 175 MCG tablet, Take 150 mcg by mouth daily before breakfast., Disp: , Rfl:    loratadine  (CLARITIN ) 10 MG tablet, Take 1 tablet (10 mg total) by mouth daily., Disp: 30 tablet, Rfl: 5   losartan (COZAAR) 25 MG tablet, Take 25 mg by mouth daily., Disp: , Rfl:    magnesium oxide (MAG-OX) 400 (240 Mg) MG tablet, Take 400 mg by mouth daily., Disp: , Rfl:    methotrexate  (RHEUMATREX) 2.5 MG tablet, Take 6 tablets (15 mg total) by mouth every Sunday. (Patient taking differently: Take 15 mg by mouth every Sunday. 5 tabs every Sunday), Disp: 4 tablet, Rfl: 0   montelukast  (SINGULAIR ) 10  MG tablet, Take 1 tablet (10 mg total) by mouth every morning., Disp: 90 tablet, Rfl: 3   simvastatin  (ZOCOR ) 40 MG tablet, Take 40 mg by mouth every morning., Disp: , Rfl:    Spacer/Aero-Holding Chambers DEVI, 2 puffs by Does not apply route 2 (two) times daily. Use with inhaler twice daily, Disp: 1 each, Rfl: 0   vitamin B-12 (CYANOCOBALAMIN ) 1000 MCG tablet, Take 1,000 mcg by mouth every morning., Disp: , Rfl:

## 2024-04-01 NOTE — Patient Instructions (Signed)
 We will order you a new cpap machine 5-15cmH20 with humidification along with supplies and mask fitting session for full face mask.   Please schedule an appointment 4-6 weeks after starting your new machine to review a compliance report

## 2024-04-07 ENCOUNTER — Telehealth: Payer: Self-pay | Admitting: *Deleted

## 2024-04-07 NOTE — Telephone Encounter (Signed)
 Copied from CRM #[id number]. Topic: Clinical - Order For Equipment >> Apr 07, 2024  2:03 PM Isabell A wrote: Reason for CRM: Patient states he hasn't heard from anyone in regard to fitting for a new mask.   Callback number: [phone]  Called the pt and there was no answer- LMTCB

## 2024-04-08 NOTE — Telephone Encounter (Signed)
 Order was placed for mask fitting with CPAP order can someone check on this for pt

## 2024-04-08 NOTE — Telephone Encounter (Signed)
 Per Arvella at [facility]- This order has not had time to process. Its currenlty pending review by intake team. Order was recived on 04/06/2024 and processed on to intake on 04-06-2024.

## 2024-04-08 NOTE — Telephone Encounter (Signed)
 I have sent Brad a message in regards to this

## 2024-04-08 NOTE — Telephone Encounter (Signed)
 Pt notified he will get a phone call from [facility] to set up this as they have the order

## 2024-04-08 NOTE — Telephone Encounter (Signed)
 It was completed through [facility]

## 2024-04-19 DIAGNOSIS — G4733 Obstructive sleep apnea (adult) (pediatric): Secondary | ICD-10-CM | POA: Diagnosis not present

## 2024-04-19 NOTE — Telephone Encounter (Signed)
 Left voicemail for patient to call back to setup cpap compliance appointment between 05/20/2024 through 07/18/2024

## 2024-04-20 ENCOUNTER — Telehealth: Payer: Self-pay

## 2024-04-20 NOTE — Telephone Encounter (Signed)
 Copied from CRM [id number]. Topic: General - Other >> Apr 20, 2024  8:42 AM Benton O wrote: Reason for CRM: patient said dr dewald said to call and let him know that he has started the cpap. Patient started  last night with cpap Just wanted to let the doctor know   Noted. Spoke w/ patient he is scheduled for follow up need to be changed to a later time due to another appointment in the morning

## 2024-05-07 ENCOUNTER — Ambulatory Visit: Admitting: Podiatry

## 2024-05-07 ENCOUNTER — Encounter: Payer: Self-pay | Admitting: Podiatry

## 2024-05-07 DIAGNOSIS — M79675 Pain in left toe(s): Secondary | ICD-10-CM

## 2024-05-07 DIAGNOSIS — M79674 Pain in right toe(s): Secondary | ICD-10-CM | POA: Diagnosis not present

## 2024-05-07 DIAGNOSIS — B351 Tinea unguium: Secondary | ICD-10-CM

## 2024-05-07 NOTE — Progress Notes (Addendum)
 This patient presents to the office with chief complaint of long thick painful nails.  Patient says the nails are painful walking and wearing shoes.  This patient is unable to self treat.  This patient is unable to trim his nails since she is unable to reach his nails.  he presents to the office for preventative foot care services.  General Appearance  Alert, conversant and in no acute stress.  Vascular  Dorsalis pedis and posterior tibial  pulses are not  palpable  bilaterally.  Capillary return is within normal limits  bilaterally. Temperature is within normal limits  bilaterally.  Neurologic  Senn-Weinstein monofilament wire test within normal limits  bilaterally. Muscle power within normal limits bilaterally.  Nails Thick disfigured discolored nails with subungual debris  from hallux to fifth toes bilaterally. No evidence of bacterial infection or drainage bilaterally.  Orthopedic  No limitations of motion  feet .  No crepitus or effusions noted.  No bony pathology or digital deformities noted.  Skin  normotropic skin with no porokeratosis noted bilaterally.  No signs of infections or ulcers noted.     Onychomycosis  Nails  B/L.  Pain in right toes  Pain in left toes  Debridement of nails both feet followed trimming the nails with dremel tool.    RTC 4 months.   Cordella Bold DPM  tomma

## 2024-06-28 DIAGNOSIS — D692 Other nonthrombocytopenic purpura: Secondary | ICD-10-CM | POA: Diagnosis not present

## 2024-06-28 DIAGNOSIS — L400 Psoriasis vulgaris: Secondary | ICD-10-CM | POA: Diagnosis not present

## 2024-06-28 DIAGNOSIS — L570 Actinic keratosis: Secondary | ICD-10-CM | POA: Diagnosis not present

## 2024-06-30 ENCOUNTER — Encounter: Payer: Self-pay | Admitting: Pulmonary Disease

## 2024-06-30 ENCOUNTER — Ambulatory Visit: Admitting: Pulmonary Disease

## 2024-06-30 VITALS — BP 122/54 | HR 73 | Ht 67.0 in | Wt 159.8 lb

## 2024-06-30 DIAGNOSIS — G4733 Obstructive sleep apnea (adult) (pediatric): Secondary | ICD-10-CM

## 2024-06-30 DIAGNOSIS — J449 Chronic obstructive pulmonary disease, unspecified: Secondary | ICD-10-CM

## 2024-06-30 NOTE — Patient Instructions (Addendum)
 Your CPAP compliance looks good. Recommend using it for 4 hours or more per night.  Continue breztri  inhaler 2 puffs twice daily - rinse mouth out after each use  Use albuterol  inhaler 1-2 puffs every 4-6 hours as needed  We will order you a flutter valve to use 2-3 times per day to help with mucous clearance.   Follow up in 6 months

## 2024-06-30 NOTE — Progress Notes (Signed)
 Established Patient Pulmonology Office Visit   Subjective:  Patient ID: Harry Lucas, male    DOB: [birth year]  MRN: [id number]  CC:  Chief Complaint  Patient presents with   Medical Management of Chronic Issues    Pt states Cpap going well     Discussed the use of AI scribe software for clinical note transcription with the patient, who gave verbal consent to proceed.  History of Present Illness Harry Lucas is an 78 year old male with COPD and obstructive sleep apnea who presents for a follow-up visit.  He was last seen on April 01, 2024, and started on CPAP therapy for mild obstructive sleep apnea, which is associated with significant fatigue and daytime sleepiness. He feels great with five to six hours of uninterrupted sleep using CPAP, but disturbances like noise can reduce his sleep to as little as an hour and a half, causing tiredness. He uses CPAP every night but sometimes removes it before four hours due to issues. He does not use CPAP during naps, which he takes in a recliner.  For COPD, he is on Breztri , two puffs twice a day, and uses albuterol  as needed. He occasionally uses his rescue inhaler, especially when golfing. He is compliant with Breztri  but is running low on his supply. He quit smoking several years ago but continues to produce significant phlegm throughout the day. He has not used a nebulizer recently but has used a breathing device with a blue bobber in the past. The mucus is not particularly thick, and he spends most of his day indoors.        ROS    Current Outpatient Medications:    acetaminophen  (TYLENOL ) 500 MG tablet, Take 1,000 mg by mouth every 6 (six) hours as needed for headache (pain)., Disp: , Rfl:    albuterol  (PROVENTIL ) (2.5 MG/3ML) 0.083% nebulizer solution, Take 3 mLs (2.5 mg total) by nebulization every 6 (six) hours as needed for wheezing or shortness of breath., Disp: 75 mL, Rfl: 12   albuterol  (VENTOLIN  HFA) 108 (90 Base)  MCG/ACT inhaler, Inhale 1-2 puffs into the lungs every 6 (six) hours as needed for wheezing or shortness of breath., Disp: 1 each, Rfl: 11   azelastine  (ASTELIN ) 0.1 % nasal spray, Place 2 sprays into both nostrils 2 (two) times daily. Use in each nostril as directed, Disp: 30 mL, Rfl: 2   betamethasone dipropionate 0.05 % lotion, Apply 1 application topically daily as needed (psoriasis/eczema)., Disp: , Rfl:    budesonide-glycopyrrolate-formoterol  (BREZTRI  AEROSPHERE) 160-9-4.8 MCG/ACT AERO inhaler, Inhale 2 puffs into the lungs in the morning and at bedtime., Disp: 10.7 g, Rfl: 11   cholecalciferol (VITAMIN D) 25 MCG (1000 UNIT) tablet, Take 1,000 Units by mouth every morning., Disp: , Rfl:    FLUoxetine (PROZAC) 40 MG capsule, Take 40 mg by mouth every morning., Disp: , Rfl:    fluticasone  (FLONASE ) 50 MCG/ACT nasal spray, Place 2 sprays into both nostrils daily., Disp: 18.2 mL, Rfl: 2   folic acid (FOLVITE) 1 MG tablet, Take 1 mg by mouth every morning., Disp: , Rfl:    hydrocortisone 2.5 % cream, Apply 1 application topically 2 (two) times daily as needed (psoriasis/eczema)., Disp: , Rfl:    levothyroxine (SYNTHROID) 175 MCG tablet, Take 150 mcg by mouth daily before breakfast., Disp: , Rfl:    loratadine  (CLARITIN ) 10 MG tablet, Take 1 tablet (10 mg total) by mouth daily., Disp: 30 tablet, Rfl: 5   losartan (COZAAR) 25 MG tablet, Take  25 mg by mouth daily., Disp: , Rfl:    magnesium oxide (MAG-OX) 400 (240 Mg) MG tablet, Take 400 mg by mouth daily., Disp: , Rfl:    methotrexate  (RHEUMATREX) 2.5 MG tablet, Take 6 tablets (15 mg total) by mouth every Sunday. (Patient taking differently: Take 15 mg by mouth every Sunday. 5 tabs every Sunday), Disp: 4 tablet, Rfl: 0   montelukast  (SINGULAIR ) 10 MG tablet, Take 1 tablet (10 mg total) by mouth every morning., Disp: 90 tablet, Rfl: 3   simvastatin  (ZOCOR ) 40 MG tablet, Take 40 mg by mouth every morning., Disp: , Rfl:    Spacer/Aero-Holding Chambers  DEVI, 2 puffs by Does not apply route 2 (two) times daily. Use with inhaler twice daily, Disp: 1 each, Rfl: 0   vitamin B-12 (CYANOCOBALAMIN ) 1000 MCG tablet, Take 1,000 mcg by mouth every morning., Disp: , Rfl:       Objective:  BP (!) 122/54   Pulse 73   Ht 5' 7 (1.702 m) Comment: per pt  Wt 159 lb 12.8 oz (72.5 kg)   SpO2 96%   BMI 25.03 kg/m     Physical Exam Constitutional:      General: He is not in acute distress.    Appearance: Normal appearance.  Eyes:     General: No scleral icterus.    Conjunctiva/sclera: Conjunctivae normal.  Cardiovascular:     Rate and Rhythm: Normal rate and regular rhythm.  Pulmonary:     Breath sounds: No wheezing, rhonchi or rales.  Musculoskeletal:     Right lower leg: No edema.     Left lower leg: No edema.  Skin:    General: Skin is warm and dry.  Neurological:     General: No focal deficit present.      Diagnostic Review:  Last CBC Lab Results  Component Value Date   WBC 15.3 (H) 08/12/2021   HGB 11.5 (L) 08/12/2021   HCT 34.9 (L) 08/12/2021   MCV 101.5 (H) 08/12/2021   MCH 33.4 08/12/2021   RDW 13.3 08/12/2021   PLT 246 08/12/2021   Last metabolic panel Lab Results  Component Value Date   GLUCOSE 155 (H) 08/12/2021   NA 136 08/12/2021   K 4.3 08/12/2021   CL 104 08/12/2021   CO2 22 08/12/2021   BUN 20 08/12/2021   CREATININE 1.57 (H) 08/12/2021   GFRNONAA 45 (L) 08/12/2021   CALCIUM 8.9 08/12/2021   PHOS 2.8 08/12/2021   PROT 7.2 09/04/2023   ALBUMIN 3.1 (L) 08/12/2021   BILITOT 0.7 08/12/2021   ALKPHOS 55 08/12/2021   AST 22 08/12/2021   ALT 31 08/12/2021   ANIONGAP 10 08/12/2021       Assessment & Plan:   Assessment & Plan OSA on CPAP     Chronic obstructive pulmonary disease, unspecified COPD type (HCC)  Orders:   Flutter valve; Future   Assessment and Plan Assessment & Plan Chronic obstructive pulmonary disease with chronic sputum production - Ordered flutter valve for use two to three  times daily to aid in mucus clearance. - Provided samples of Breztri  if available. - continue breztri  2 puffs twice daily - use as needed albuterol  inahler 1-2 puffs every 4-6 hours  Obstructive sleep apnea on CPAP therapy Mild obstructive sleep apnea managed with CPAP. Good compliance. - Continue CPAP therapy      Return in about 6 months (around 12/28/2024) for f/u visit Dr. Kara.   Dorn KATHEE Kara, MD

## 2024-06-30 NOTE — Assessment & Plan Note (Signed)
  Orders:   Flutter valve; Future

## 2024-06-30 NOTE — Assessment & Plan Note (Signed)
 SABRA

## 2024-07-06 DIAGNOSIS — E039 Hypothyroidism, unspecified: Secondary | ICD-10-CM | POA: Diagnosis not present

## 2024-07-06 DIAGNOSIS — I700 Atherosclerosis of aorta: Secondary | ICD-10-CM | POA: Diagnosis not present

## 2024-07-06 DIAGNOSIS — R911 Solitary pulmonary nodule: Secondary | ICD-10-CM | POA: Diagnosis not present

## 2024-07-06 DIAGNOSIS — E785 Hyperlipidemia, unspecified: Secondary | ICD-10-CM | POA: Diagnosis not present

## 2024-07-06 DIAGNOSIS — I2510 Atherosclerotic heart disease of native coronary artery without angina pectoris: Secondary | ICD-10-CM | POA: Diagnosis not present

## 2024-07-06 DIAGNOSIS — F331 Major depressive disorder, recurrent, moderate: Secondary | ICD-10-CM | POA: Diagnosis not present

## 2024-07-06 DIAGNOSIS — N401 Enlarged prostate with lower urinary tract symptoms: Secondary | ICD-10-CM | POA: Diagnosis not present

## 2024-07-06 DIAGNOSIS — Z0000 Encounter for general adult medical examination without abnormal findings: Secondary | ICD-10-CM | POA: Diagnosis not present

## 2024-07-06 DIAGNOSIS — Z1339 Encounter for screening examination for other mental health and behavioral disorders: Secondary | ICD-10-CM | POA: Diagnosis not present

## 2024-07-06 DIAGNOSIS — N1831 Chronic kidney disease, stage 3a: Secondary | ICD-10-CM | POA: Diagnosis not present

## 2024-07-06 DIAGNOSIS — I739 Peripheral vascular disease, unspecified: Secondary | ICD-10-CM | POA: Diagnosis not present

## 2024-07-06 DIAGNOSIS — M21372 Foot drop, left foot: Secondary | ICD-10-CM | POA: Diagnosis not present

## 2024-07-06 DIAGNOSIS — I129 Hypertensive chronic kidney disease with stage 1 through stage 4 chronic kidney disease, or unspecified chronic kidney disease: Secondary | ICD-10-CM | POA: Diagnosis not present

## 2024-07-06 DIAGNOSIS — Z79899 Other long term (current) drug therapy: Secondary | ICD-10-CM | POA: Diagnosis not present

## 2024-07-06 DIAGNOSIS — J449 Chronic obstructive pulmonary disease, unspecified: Secondary | ICD-10-CM | POA: Diagnosis not present

## 2024-07-06 DIAGNOSIS — Z23 Encounter for immunization: Secondary | ICD-10-CM | POA: Diagnosis not present

## 2024-07-19 DIAGNOSIS — G4733 Obstructive sleep apnea (adult) (pediatric): Secondary | ICD-10-CM | POA: Diagnosis not present

## 2024-08-02 ENCOUNTER — Telehealth: Payer: Self-pay | Admitting: Pulmonary Disease

## 2024-08-02 ENCOUNTER — Telehealth: Payer: Self-pay

## 2024-08-02 ENCOUNTER — Other Ambulatory Visit: Payer: Self-pay

## 2024-08-02 MED ORDER — ALBUTEROL SULFATE HFA 108 (90 BASE) MCG/ACT IN AERS: 1.0000 | INHALATION_SPRAY | Freq: Four times a day (QID) | RESPIRATORY_TRACT | 11 refills | Status: DC | PRN

## 2024-08-02 MED ORDER — ALBUTEROL SULFATE HFA 108 (90 BASE) MCG/ACT IN AERS: 1.0000 | INHALATION_SPRAY | Freq: Four times a day (QID) | RESPIRATORY_TRACT | 11 refills | Status: AC | PRN

## 2024-08-02 NOTE — Telephone Encounter (Signed)
 Copied from CRM #[id number]. Topic: Clinical - Medication Refill >> Aug 02, 2024  8:02 AM Harry Lucas wrote: Medication: albuterol  (VENTOLIN  HFA) 108 (90 Base) MCG/ACT inhaler  Has the patient contacted their pharmacy? Yes - Pharmacy states has called several times, no documentation.   This is the patient's preferred pharmacy:  [facility] [id number] - [place], [place] - [address] [place] [address] Phone: [phone] Fax: [phone]  Is this the correct pharmacy for this prescription? Yes If no, delete pharmacy and type the correct one.   Has the prescription been filled recently? No  Is the patient out of the medication? Yes  Has the patient been seen for an appointment in the last year OR does the patient have an upcoming appointment? Yes  Can we respond through MyChart? No  Agent: Please be advised that Rx refills may take up to 3 business days. We ask that you follow-up with your pharmacy.

## 2024-08-02 NOTE — Telephone Encounter (Signed)
 This request was just made today , Rx has been refilled      Copied from CRM #[id number]. Topic: Clinical - Medication Refill >> Aug 02, 2024  8:02 AM Joesph PARAS wrote: Medication: albuterol  (VENTOLIN  HFA) 108 (90 Base) MCG/ACT inhaler  Has the patient contacted their pharmacy? Yes - Pharmacy states has called several times, no documentation.   This is the patient's preferred pharmacy:  [facility] [id number] - [place], [place] - [address] [place] [address] Phone: [phone] Fax: [phone]  Is this the correct pharmacy for this prescription? Yes If no, delete pharmacy and type the correct one.   Has the prescription been filled recently? No  Is the patient out of the medication? Yes  Has the patient been seen for an appointment in the last year OR does the patient have an upcoming appointment? Yes  Can we respond through MyChart? No  Agent: Please be advised that Rx refills may take up to 3 business days. We ask that you follow-up with your pharmacy. >> Aug 02, 2024 10:42 AM Rozanna MATSU wrote: Tammy with [facility] calling about the prescription refill for albuterol  (VENTOLIN  HFA) 108 (90 Base) MCG/ACT inhaler

## 2024-08-02 NOTE — Telephone Encounter (Signed)
 Rx refilled.

## 2024-08-09 ENCOUNTER — Ambulatory Visit: Admitting: Podiatry

## 2024-08-09 ENCOUNTER — Telehealth: Payer: Self-pay

## 2024-08-09 DIAGNOSIS — J449 Chronic obstructive pulmonary disease, unspecified: Secondary | ICD-10-CM

## 2024-08-09 NOTE — Telephone Encounter (Signed)
 Copied from CRM #[id number]. Topic: Clinical - Order For Equipment >> Aug 06, 2024  8:37 AM Harry Lucas wrote: Reason for CRM: pt called in regarding order for flutter valve placed on 06/30/24, he states he has yet to receive it or hear back regarding it - he will call his DME company to see if they received the order - pt would like Lucas call back on Monday with further information regarding when to expect an update on the flutter valve  Sending community message to [employer]

## 2024-08-09 NOTE — Telephone Encounter (Signed)
 New order for placed for flutter as the previous order at the [facility] was incorrect.  [facility] please send ASAP

## 2024-08-25 ENCOUNTER — Ambulatory Visit: Admitting: Podiatry
